# Patient Record
Sex: Female | Born: 1990 | Race: White | Hispanic: No | Marital: Married | Smoking: Never smoker
Health system: Southern US, Community
[De-identification: ages and names within clinical notes are randomized; demographics above are authoritative.]

## PROBLEM LIST (undated history)

## (undated) DIAGNOSIS — I10 Essential (primary) hypertension: Secondary | ICD-10-CM

## (undated) HISTORY — PX: ANKLE SURGERY: SHX546

## (undated) HISTORY — PX: NOSE SURGERY: SHX723

---

## 2018-04-22 NOTE — L&D Delivery Note (Addendum)
Operative Delivery Note At 6:37 AM a viable and healthy female was delivered via Vaginal, Spontaneous.  Presentation: vertex; Position: Right,, Occiput,, Anterior; Station: +5.  Delivery of the head: 07/28/2018  6:37 AM First maneuver: 07/28/2018  6:37 AM, [place] Infants head delivered. Initial attempt to deliver the anterior shoulder was not successful. A nuchal cord was found and reduced on the perineum. The patient was placed in [place] and the anterior shoulder delivered followed by the body. Duration of shoulder 15 seconds. The infant was passed to the maternal abdomen and was noted to be floppy but had an appropriate heart rate. The infant was vigorously stimulated and suctioned and made weak attempts to cry. THe cord was clamed and cut and the infant was passed to the waiting isolette for additional resuscitation. Ultimately a code apgar was called due to poor breathing efforts. The placenta was delivered spontaneously, intact, with 3VC. Uterine tone remained firm. A second degree perineal laceration was repaired with 3-0 vicryl. Amniotic fluid volume was not measured prior to delivery of the placenta. The measured blood loss was greater than clinically suspected.  Verbal consent: obtained from patient.  APGAR: 6, 7; weight pending .   Placenta status: spontaneous, intact.   Cord: 3V with nuchal cord reduced on the perineum  Anesthesia:  Epidural Episiotomy: None Lacerations: 2nd degree Suture Repair: 3.0 vicryl Est. Blood Loss (mL): 771  Mom to postpartum.  Baby to NICU  Waynard Reeds 07/28/2018, 7:06 AM

## 2018-07-13 LAB — OB RESULTS CONSOLE GBS: GBS: NEGATIVE

## 2018-07-24 ENCOUNTER — Other Ambulatory Visit: Payer: Self-pay | Admitting: Obstetrics and Gynecology

## 2018-07-27 ENCOUNTER — Inpatient Hospital Stay (HOSPITAL_COMMUNITY): Payer: 59 | Admitting: Anesthesiology

## 2018-07-27 ENCOUNTER — Other Ambulatory Visit: Payer: Self-pay

## 2018-07-27 ENCOUNTER — Inpatient Hospital Stay (HOSPITAL_COMMUNITY)
Admission: AD | Admit: 2018-07-27 | Discharge: 2018-07-30 | DRG: 807 | Disposition: A | Discharge status: Home / Self Care | Payer: 59 | Attending: Obstetrics and Gynecology | Admitting: Obstetrics and Gynecology

## 2018-07-27 ENCOUNTER — Encounter (HOSPITAL_COMMUNITY): Payer: Self-pay

## 2018-07-27 DIAGNOSIS — I10 Essential (primary) hypertension: Secondary | ICD-10-CM | POA: Diagnosis present

## 2018-07-27 DIAGNOSIS — Z3A39 39 weeks gestation of pregnancy: Secondary | ICD-10-CM

## 2018-07-27 DIAGNOSIS — O1002 Pre-existing essential hypertension complicating childbirth: Principal | ICD-10-CM | POA: Diagnosis present

## 2018-07-27 HISTORY — DX: Essential (primary) hypertension: I10

## 2018-07-27 LAB — COMPREHENSIVE METABOLIC PANEL
ALT: 24 U/L (ref 0–44)
AST: 24 U/L (ref 15–41)
Albumin: 3.1 g/dL — ABNORMAL LOW (ref 3.5–5.0)
Alkaline Phosphatase: 115 U/L (ref 38–126)
Anion gap: 10 (ref 5–15)
BUN: 7 mg/dL (ref 6–20)
CO2: 22 mmol/L (ref 22–32)
Calcium: 8.8 mg/dL — ABNORMAL LOW (ref 8.9–10.3)
Chloride: 100 mmol/L (ref 98–111)
Creatinine, Ser: 0.55 mg/dL (ref 0.44–1.00)
GFR calc Af Amer: >60 mL/min (ref >60)
GFR calc non Af Amer: >60 mL/min (ref >60)
Glucose, Bld: 80 mg/dL (ref 70–99)
Potassium: 3.3 mmol/L — ABNORMAL LOW (ref 3.5–5.1)
Sodium: 132 mmol/L — ABNORMAL LOW (ref 135–145)
Total Bilirubin: 0.4 mg/dL (ref 0.3–1.2)
Total Protein: 6.1 g/dL — ABNORMAL LOW (ref 6.5–8.1)

## 2018-07-27 LAB — CBC
HCT: 38.5 % (ref 36.0–46.0)
HCT: 39.5 % (ref 36.0–46.0)
Hemoglobin: 12.2 g/dL (ref 12.0–15.0)
Hemoglobin: 12.5 g/dL (ref 12.0–15.0)
MCH: 29.7 pg (ref 26.0–34.0)
MCH: 29.7 pg (ref 26.0–34.0)
MCHC: 31.7 g/dL (ref 30.0–36.0)
MCHC: 31.6 g/dL (ref 30.0–36.0)
MCV: 93.7 fL (ref 80.0–100.0)
MCV: 93.8 fL (ref 80.0–100.0)
Platelets: 237 10*3/uL (ref 150–400)
Platelets: 226 10*3/uL (ref 150–400)
RBC: 4.11 MIL/uL (ref 3.87–5.11)
RBC: 4.21 MIL/uL (ref 3.87–5.11)
RDW: 13.4 % (ref 11.5–15.5)
RDW: 13.4 % (ref 11.5–15.5)
WBC: 10.3 10*3/uL (ref 4.0–10.5)
WBC: 14.5 10*3/uL — ABNORMAL HIGH (ref 4.0–10.5)
nRBC: 0 % (ref 0.0–0.2)
nRBC: 0 % (ref 0.0–0.2)

## 2018-07-27 LAB — TYPE AND SCREEN
ABO/RH(D): O POS
Antibody Screen: NEGATIVE

## 2018-07-27 LAB — ABO/RH: ABO/RH(D): O POS

## 2018-07-27 LAB — URIC ACID: Uric Acid, Serum: 3 mg/dL (ref 2.5–7.1)

## 2018-07-27 LAB — RPR: RPR Ser Ql: NONREACTIVE

## 2018-07-27 LAB — LACTATE DEHYDROGENASE: LDH: 130 U/L (ref 98–192)

## 2018-07-27 MED ORDER — FENTANYL-BUPIVACAINE-NACL 0.5-0.125-0.9 MG/250ML-% EP SOLN
12.0000 mL/h | EPIDURAL | Status: DC | PRN
  Filled 2018-07-27: qty 250

## 2018-07-27 MED ORDER — LIDOCAINE HCL (PF) 1 % IJ SOLN
30.0000 mL | INTRAMUSCULAR | Status: AC | PRN
  Administered 2018-07-28: 30 mL via SUBCUTANEOUS
  Filled 2018-07-27 (×2): qty 30

## 2018-07-27 MED ORDER — SOD CITRATE-CITRIC ACID 500-334 MG/5ML PO SOLN: 30.0000 mL | ORAL | Status: DC | PRN

## 2018-07-27 MED ORDER — OXYTOCIN 40 UNITS IN NORMAL SALINE INFUSION - SIMPLE MED
2.5000 [IU]/h | INTRAVENOUS | Status: DC
  Filled 2018-07-27: qty 1000

## 2018-07-27 MED ORDER — OXYTOCIN BOLUS FROM INFUSION
500.0000 mL | Freq: Once | INTRAVENOUS | Status: AC
  Administered 2018-07-28: 500 mL via INTRAVENOUS

## 2018-07-27 MED ORDER — ONDANSETRON HCL 4 MG/2ML IJ SOLN
4.0000 mg | Freq: Four times a day (QID) | INTRAMUSCULAR | Status: DC | PRN
  Administered 2018-07-27: 4 mg via INTRAVENOUS
  Filled 2018-07-27: qty 2

## 2018-07-27 MED ORDER — MISOPROSTOL 25 MCG QUARTER TABLET
25.0000 ug | ORAL_TABLET | ORAL | Status: DC | PRN
  Administered 2018-07-27 (×2): 25 ug via VAGINAL
  Filled 2018-07-27 (×3): qty 1

## 2018-07-27 MED ORDER — OXYTOCIN 40 UNITS IN NORMAL SALINE INFUSION - SIMPLE MED
1.0000 m[IU]/min | INTRAVENOUS | Status: DC
  Administered 2018-07-27: 6 m[IU]/min via INTRAVENOUS
  Administered 2018-07-27: 2 m[IU]/min via INTRAVENOUS

## 2018-07-27 MED ORDER — EPHEDRINE 5 MG/ML INJ
10.0000 mg | INTRAVENOUS | Status: DC | PRN
  Filled 2018-07-27: qty 2

## 2018-07-27 MED ORDER — ACETAMINOPHEN 325 MG PO TABS
650.0000 mg | ORAL_TABLET | ORAL | Status: DC | PRN
  Administered 2018-07-28: 650 mg via ORAL
  Filled 2018-07-27: qty 2

## 2018-07-27 MED ORDER — LIDOCAINE HCL (PF) 1 % IJ SOLN
INTRAMUSCULAR | Status: DC | PRN
  Administered 2018-07-27 (×2): 5 mL via EPIDURAL

## 2018-07-27 MED ORDER — LACTATED RINGERS IV SOLN
500.0000 mL | INTRAVENOUS | Status: DC | PRN
  Administered 2018-07-28: 500 mL via INTRAVENOUS

## 2018-07-27 MED ORDER — LACTATED RINGERS IV SOLN: 500.0000 mL | Freq: Once | INTRAVENOUS | Status: DC

## 2018-07-27 MED ORDER — DIPHENHYDRAMINE HCL 50 MG/ML IJ SOLN: 12.5000 mg | INTRAMUSCULAR | Status: DC | PRN

## 2018-07-27 MED ORDER — BUTORPHANOL TARTRATE 1 MG/ML IJ SOLN
1.0000 mg | INTRAMUSCULAR | Status: DC | PRN
  Administered 2018-07-27: 1 mg via INTRAVENOUS
  Filled 2018-07-27: qty 1

## 2018-07-27 MED ORDER — TERBUTALINE SULFATE 1 MG/ML IJ SOLN: 0.2500 mg | Freq: Once | INTRAMUSCULAR | Status: DC | PRN

## 2018-07-27 MED ORDER — PHENYLEPHRINE 40 MCG/ML (10ML) SYRINGE FOR IV PUSH (FOR BLOOD PRESSURE SUPPORT)
80.0000 ug | PREFILLED_SYRINGE | INTRAVENOUS | Status: DC | PRN
  Filled 2018-07-27: qty 10

## 2018-07-27 MED ORDER — SODIUM CHLORIDE (PF) 0.9 % IJ SOLN
INTRAMUSCULAR | Status: DC | PRN
  Administered 2018-07-27: 12 mL/h via EPIDURAL

## 2018-07-27 MED ORDER — LACTATED RINGERS IV SOLN
INTRAVENOUS | Status: DC
  Administered 2018-07-27 – 2018-07-28 (×4): via INTRAVENOUS

## 2018-07-27 NOTE — Progress Notes (Signed)
Patient ID: Dominique Barnes, female   DOB: 1991-02-18, 28 y.o.   MRN: [id number]   S: Comfortable after epidural O:  Vitals:   07/27/18 1417 07/27/18 1421 07/27/18 1426 07/27/18 1431  BP: 130/74 127/71 126/70 120/70  Pulse: 78 90 82 78  Resp:      Temp:    98.5 F (36.9 C)  TempSrc:      Weight:      Height:       AOx 3 Cvx 4-5/60/-2 FHR 130 reactive, cat 1 tracing toco Q2-3  AROM clear fluid  Start pit 2 x 2

## 2018-07-27 NOTE — Anesthesia Preprocedure Evaluation (Signed)
Anesthesia Evaluation  Patient identified by MRN, date of birth, ID band Patient awake    Reviewed: Allergy & Precautions, NPO status , Patient's Chart, lab work & pertinent test results  Airway Mallampati: II  TM Distance: >3 FB     Dental no notable dental hx. (+) Dental Advisory Given   Pulmonary neg pulmonary ROS,    Pulmonary exam normal        Cardiovascular hypertension, negative cardio ROS Normal cardiovascular exam     Neuro/Psych negative neurological ROS  negative psych ROS   GI/Hepatic negative GI ROS, Neg liver ROS,   Endo/Other  negative endocrine ROS  Renal/GU negative Renal ROS  negative genitourinary   Musculoskeletal negative musculoskeletal ROS (+)   Abdominal   Peds negative pediatric ROS (+)  Hematology negative hematology ROS (+)   Anesthesia Other Findings   Reproductive/Obstetrics (+) Pregnancy                             Anesthesia Physical Anesthesia Plan  ASA: II  Anesthesia Plan: Epidural   Post-op Pain Management:    Induction:   PONV Risk Score and Plan:   Airway Management Planned: Natural Airway  Additional Equipment:   Intra-op Plan:   Post-operative Plan:   Informed Consent: I have reviewed the patients History and Physical, chart, labs and discussed the procedure including the risks, benefits and alternatives for the proposed anesthesia with the patient or authorized representative who has indicated his/her understanding and acceptance.     Dental advisory given  Plan Discussed with: Anesthesiologist  Anesthesia Plan Comments:         Anesthesia Quick Evaluation

## 2018-07-28 ENCOUNTER — Encounter (HOSPITAL_COMMUNITY): Payer: Self-pay | Admitting: Neonatal-Perinatal Medicine

## 2018-07-28 MED ORDER — SENNOSIDES-DOCUSATE SODIUM 8.6-50 MG PO TABS
2.0000 | ORAL_TABLET | ORAL | Status: DC
  Administered 2018-07-29 (×2): 2 via ORAL
  Filled 2018-07-28 (×3): qty 2

## 2018-07-28 MED ORDER — IBUPROFEN 600 MG PO TABS
600.0000 mg | ORAL_TABLET | Freq: Four times a day (QID) | ORAL | Status: DC
  Administered 2018-07-28 – 2018-07-30 (×7): 600 mg via ORAL
  Filled 2018-07-28 (×7): qty 1

## 2018-07-28 MED ORDER — ACETAMINOPHEN 325 MG PO TABS: 650.0000 mg | ORAL_TABLET | ORAL | Status: DC | PRN

## 2018-07-28 MED ORDER — OXYCODONE HCL 5 MG PO TABS: 10.0000 mg | ORAL_TABLET | ORAL | Status: DC | PRN

## 2018-07-28 MED ORDER — DIPHENHYDRAMINE HCL 25 MG PO CAPS: 25.0000 mg | ORAL_CAPSULE | Freq: Four times a day (QID) | ORAL | Status: DC | PRN

## 2018-07-28 MED ORDER — PRENATAL MULTIVITAMIN CH
1.0000 | ORAL_TABLET | Freq: Every day | ORAL | Status: DC
  Administered 2018-07-28 – 2018-07-29 (×2): 1 via ORAL
  Filled 2018-07-28 (×3): qty 1

## 2018-07-28 MED ORDER — OXYCODONE HCL 5 MG PO TABS: 5.0000 mg | ORAL_TABLET | ORAL | Status: DC | PRN

## 2018-07-28 MED ORDER — ONDANSETRON HCL 4 MG PO TABS: 4.0000 mg | ORAL_TABLET | ORAL | Status: DC | PRN

## 2018-07-28 MED ORDER — SIMETHICONE 80 MG PO CHEW
80.0000 mg | CHEWABLE_TABLET | ORAL | Status: DC | PRN
  Administered 2018-07-30: 80 mg via ORAL
  Filled 2018-07-28: qty 1

## 2018-07-28 MED ORDER — BENZOCAINE-MENTHOL 20-0.5 % EX AERO
1.0000 "application " | INHALATION_SPRAY | CUTANEOUS | Status: DC | PRN
  Administered 2018-07-28 – 2018-07-30 (×2): 1 via TOPICAL
  Filled 2018-07-28 (×2): qty 56

## 2018-07-28 MED ORDER — TETANUS-DIPHTH-ACELL PERTUSSIS 5-2.5-18.5 LF-MCG/0.5 IM SUSP: 0.5000 mL | Freq: Once | INTRAMUSCULAR | Status: DC

## 2018-07-28 MED ORDER — WITCH HAZEL-GLYCERIN EX PADS: 1.0000 "application " | MEDICATED_PAD | CUTANEOUS | Status: DC | PRN

## 2018-07-28 MED ORDER — IBUPROFEN 600 MG PO TABS: 600.0000 mg | ORAL_TABLET | Freq: Four times a day (QID) | ORAL | Status: DC | PRN

## 2018-07-28 MED ORDER — DIBUCAINE 1 % RE OINT
1.0000 "application " | TOPICAL_OINTMENT | RECTAL | Status: DC | PRN
  Administered 2018-07-30: 1 via RECTAL
  Filled 2018-07-28 (×4): qty 28

## 2018-07-28 MED ORDER — METHYLDOPA 250 MG PO TABS
250.0000 mg | ORAL_TABLET | Freq: Two times a day (BID) | ORAL | Status: DC
  Administered 2018-07-28 – 2018-07-30 (×5): 250 mg via ORAL
  Filled 2018-07-28 (×7): qty 1

## 2018-07-28 MED ORDER — ONDANSETRON HCL 4 MG/2ML IJ SOLN: 4.0000 mg | INTRAMUSCULAR | Status: DC | PRN

## 2018-07-28 MED ORDER — ACETAMINOPHEN 500 MG PO TABS
1000.0000 mg | ORAL_TABLET | Freq: Once | ORAL | Status: AC
  Administered 2018-07-28: 1000 mg via ORAL
  Filled 2018-07-28: qty 2

## 2018-07-28 MED ORDER — COCONUT OIL OIL: 1.0000 "application " | TOPICAL_OIL | Status: DC | PRN

## 2018-07-28 NOTE — Progress Notes (Signed)
Epidural capped at 0655.  Epidural line not in to chart on.

## 2018-07-28 NOTE — Lactation Note (Signed)
This note was copied from a baby's chart. Lactation Consultation Note  Patient Name: Dominique Barnes [id number] Date: 07/28/2018  Parents report they are ready to go to NICU.  Mom reports she has decided she is not going to breastfeed or pump. Urged mom to at least pump while infant in NICU.  She declined at this time. Urged mom to think about it.  She has no pump for home use.  Did not contact insurance provider about a breast pump. Reviewed and Left [facility] consultation Services and NICU booklet.  Urged mom to call lactation as needed.  Maternal Data    Feeding Feeding Type: Formula Nipple Type: Slow - flow  LATCH Score                   Interventions    Lactation Tools Discussed/Used WIC Program: No   Consult Status Consult Status: Complete    Angeliah Wisdom Michaelle Copas 07/28/2018, 8:14 PM

## 2018-07-28 NOTE — Lactation Note (Signed)
This note was copied from a baby's chart. Lactation Consultation Note  Patient Name: Dominique Barnes'S Date: 07/28/2018  Attempt to see mom on first floor.  Mom in NICU.  Will follow up with her later today   Maternal Data    Feeding Feeding Type: Formula Nipple Type: Slow - flow  LATCH Score                   Interventions    Lactation Tools Discussed/Used     Consult Status      Dominique Barnes 07/28/2018, 4:18 PM

## 2018-07-28 NOTE — Progress Notes (Signed)
Epidural cath removed from lower back. Site WNL. Blue tip on cath seen upon removal.

## 2018-07-28 NOTE — Anesthesia Postprocedure Evaluation (Signed)
Anesthesia Post Note  Patient: Dominique Barnes  Procedure(s) Performed: AN AD HOC LABOR EPIDURAL     Patient location during evaluation: Specials Recovery Anesthesia Type: Epidural Level of consciousness: awake and alert Pain management: pain level controlled Vital Signs Assessment: post-procedure vital signs reviewed and stable Respiratory status: spontaneous breathing, nonlabored ventilation and respiratory function stable Cardiovascular status: stable Postop Assessment: no headache, no backache and epidural receding Anesthetic complications: no    Last Vitals:  Vitals:   07/28/18 0848 07/28/18 1014  BP: 114/61 135/77  Pulse: 83 92  Resp:  17  Temp:  37.2 C  SpO2: 98% 100%    Last Pain:  Vitals:   07/28/18 1014  TempSrc: Oral  PainSc:    Pain Goal:                   Marquette Saa

## 2018-07-29 LAB — CBC
HCT: 31 % — ABNORMAL LOW (ref 36.0–46.0)
Hemoglobin: 10.1 g/dL — ABNORMAL LOW (ref 12.0–15.0)
MCH: 30.5 pg (ref 26.0–34.0)
MCHC: 32.6 g/dL (ref 30.0–36.0)
MCV: 93.7 fL (ref 80.0–100.0)
Platelets: 159 10*3/uL (ref 150–400)
RBC: 3.31 MIL/uL — ABNORMAL LOW (ref 3.87–5.11)
RDW: 13.8 % (ref 11.5–15.5)
WBC: 16 10*3/uL — ABNORMAL HIGH (ref 4.0–10.5)
nRBC: 0 % (ref 0.0–0.2)

## 2018-07-29 NOTE — Clinical Social Work Maternal (Signed)
CLINICAL SOCIAL WORK MATERNAL/CHILD NOTE  Patient Details  Name: Dominique Barnes MRN: [id number] Date of Birth: 17-Nov-1990  Date:  07/29/2018  Clinical Social Worker Initiating Note:  Laurey Arrow Date/Time: Initiated:  07/29/18/1133     Child's Name:  Dominique Barnes   Biological Parents:  Mother, Father   Need for Interpreter:  None   Reason for Referral:  Parental Support of Premature Babies < 32 weeks/or Critically Ill babies(Code Apgar)   Address:  [address] [place] [address]    Phone number:  [phone] (home)     Additional phone number:   Household Members/Support Persons (HM/SP):   Household Member/Support Person 1   HM/SP Name Relationship DOB or Age  HM/SP -1 Gerre Couch FOB/Husband 12/22/1991  HM/SP -2        HM/SP -3        HM/SP -4        HM/SP -5        HM/SP -6        HM/SP -7        HM/SP -8          Natural Supports (not living in the home):  Immediate Family, Extended Family, Parent   Professional Supports: None   Employment: Full-time   Type of Work: Building surveyor   Education:      Homebound arranged:    Museum/gallery curator Resources:  Multimedia programmer   Other Resources:      Cultural/Religious Considerations Which May Impact Care:  None Reported  Strengths:  Ability to meet basic needs , Home prepared for child , Pediatrician chosen   Psychotropic Medications:         Pediatrician:    [place] (including Technical sales engineer and surronding areas)  Pediatrician List:   [place]      Pediatrician Fax Number:    Risk Factors/Current Problems:  None   Cognitive State:  Able to Concentrate , Alert , Linear Thinking , Insightful    Mood/Affect:  Interested , Happy , Bright , Comfortable    CSW Assessment: CSW met with MOB and FOB in room 102 to complete an assessment for a Code Apgar.  When CSW arrived, MOB was resting in bed and FOB was watching TV.  CSW explained CSW's role and MOB gave CSW permission to complete assessment while FOB was present. Both parents were easy to engage and receptive to meeting with CSW.   CSW asked parents about their thoughts and feelings regarding infant's NICU admission. MOB and FOB expressed feeling nervous initially but feeling better now that they have a clear understanding of infant's health. MOB stated, "I'm just excited that we both will discharging tomorrow."  MOB and FOB shared their they desires to have infant to become a great basketball player since "He has big feet and hands and I am a basketball skills couch." They parents were able to have humor after a difficult birthing experience.  CSW provided education regarding the baby blues period vs. perinatal mood disorders, discussed treatment and gave resources for mental health follow up if concerns arise.  CSW recommends self-evaluation during the postpartum time period and encouraged MOB to contact a medical professional if symptoms are noted at any time.     CSW identifies no further need for intervention and no barriers to discharge at this time.  CSW Plan/Description:  No Further Intervention Required/No Barriers to Discharge, Perinatal Mood and Anxiety Disorder (PMADs) Education, Other Information/Referral to [employer], MSW, [employer] Clinical Social Work [phone]  Dimple Nanas, LCSW 07/29/2018, 11:39 AM

## 2018-07-29 NOTE — Progress Notes (Signed)
PPD#1  Pt without complaints. States baby is doing well.  VSSAF hgb-10.1 plts-159k  Imp/ stable Plan/ Routine care

## 2018-07-30 NOTE — Discharge Summary (Signed)
Obstetric Discharge Summary Reason for Admission: induction of labor and CHTN Prenatal Procedures: ultrasound Intrapartum Procedures: spontaneous vaginal delivery Postpartum Procedures: none Complications-Operative and Postpartum: shoulder dystocia Hemoglobin  Date Value Ref Range Status  07/29/2018 10.1 (L) 12.0 - 15.0 g/dL Final   HCT  Date Value Ref Range Status  07/29/2018 31.0 (L) 36.0 - 46.0 % Final    Physical Exam:  General: alert and cooperative Lochia: appropriate Uterine Fundus: firm DVT Evaluation: No evidence of DVT seen on physical exam.  Discharge Diagnoses: Term Pregnancy-delivered  Discharge Information: Date: 07/30/2018 Activity: pelvic rest Diet: routine Medications: PNV, Ibuprofen and methyldopa Condition: stable Instructions: refer to practice specific booklet Discharge to: home Follow-up Information    Waynard Reeds, MD. Call in 4 week(s).   Specialty:  Obstetrics and Gynecology Why:  Check BP at home and call with elevated readings >140/ >90.  Can call office to initially schedule f/u telemedicine appt in 4 weeks, can change to in person if having problems or if restrictions have been lifted. Contact information: [address] [place] [address] [phone]           Newborn Data: Live born female  Birth Weight: 7 lb 5.8 oz (3340 g) APGAR: 6, 7  Newborn Delivery   Time head delivered:  07/28/2018 06:37:00 Birth date/time:  07/28/2018 06:37:00 Delivery type:  Vaginal, Spontaneous     Baby to remain in NICU for jaundice  Calayah Guadarrama DO  07/30/2018, 11:05 AM

## 2018-08-01 ENCOUNTER — Inpatient Hospital Stay (HOSPITAL_COMMUNITY): Admission: AD | Admit: 2018-08-01 | Payer: 59 | Source: Home / Self Care | Admitting: Obstetrics and Gynecology

## 2018-08-14 NOTE — H&P (Signed)
Dominique Barnes is a 28 y.o. female presenting for IOL  28 yo G 2P0010 @ 39 weeks admitted for IOL for chronic hypertension OB History    Gravida  2   Para  1   Term  1   Preterm      AB      Living  1     SAB      TAB      Ectopic      Multiple  0   Live Births  1          Past Medical History:  Diagnosis Date  . Hypertension    History reviewed. No pertinent surgical history. Family History: family history is not on file. Social History:  reports that she has never smoked. She has never used smokeless tobacco. No history on file for alcohol and drug.     Maternal Diabetes: No Genetic Screening: Normal Maternal Ultrasounds/Referrals: Normal Fetal Ultrasounds or other Referrals:  None Maternal Substance Abuse:  No Significant Maternal Medications:  None Significant Maternal Lab Results:  None Other Comments:  None  ROS History Dilation: 10 Effacement (%): 90 Station: -1 Exam by:: [place]  Blood pressure 124/79, pulse 76, temperature 98.9 F (37.2 C), temperature source Oral, resp. rate 18, height 5\' 7"  (1.702 m), weight 77.6 kg, SpO2 99 %, unknown if currently breastfeeding. Exam Physical Exam  Prenatal labs: ABO, Rh: --/--/O POS, O POS (04/06 0100) Antibody: NEG (04/06 0100) Rubella:   RPR: Non Reactive (04/06 0100)  HBsAg:    HIV:    GBS: Negative (03/23 0000)   Assessment/Plan: 1) Admit 2) Epidural on request 3) Anticipate svd   Waynard Reeds 08/14/2018, 10:55 AM

## 2018-12-23 LAB — OB RESULTS CONSOLE HIV ANTIBODY (ROUTINE TESTING): HIV: NONREACTIVE

## 2018-12-23 LAB — OB RESULTS CONSOLE ABO/RH: RH Type: POSITIVE

## 2018-12-23 LAB — OB RESULTS CONSOLE GC/CHLAMYDIA
Chlamydia: NEGATIVE
Gonorrhea: NEGATIVE

## 2018-12-23 LAB — OB RESULTS CONSOLE RPR: RPR: NONREACTIVE

## 2018-12-23 LAB — OB RESULTS CONSOLE ANTIBODY SCREEN: Antibody Screen: NEGATIVE

## 2018-12-23 LAB — OB RESULTS CONSOLE RUBELLA ANTIBODY, IGM: Rubella: IMMUNE

## 2018-12-23 LAB — OB RESULTS CONSOLE HEPATITIS B SURFACE ANTIGEN: Hepatitis B Surface Ag: NEGATIVE

## 2019-04-23 NOTE — L&D Delivery Note (Signed)
Patient was C/C/+3 and pushed for 4 minutes with epidural.    NSVD.  A Female infant, Apgars 8,9, weight P.  Delivery time 1427.  I reached up and felt baby B's legs.  ROM clear. I was able to grasp both feet but unable to bring the baby down past the hymen.  The R leg retracted and I was able to deliver the L leg and then the R leg.  The baby was then delivered in the normal breech position and the head delivered with retraction of the chin.   Breech extraction B female infant, Apgars  7,9,9 , weight P. Delivery time 1432.  Cord gas was sent for B- 7.23. Baby B active after warming and short term bagging with O2.    The patient had a first degree midline laceration repaired with 2-0 vicryl. Fundus was firm. EBL was expected amount. Placenta was delivered intact. Vagina was clear.  Delayed cord clamping done for 30-60 seconds while warming baby. Baby was vigorous and doing skin to skin with mother.  Loney Laurence

## 2019-06-28 LAB — OB RESULTS CONSOLE GBS: GBS: NEGATIVE

## 2019-06-30 ENCOUNTER — Other Ambulatory Visit: Payer: Self-pay | Admitting: Obstetrics and Gynecology

## 2019-06-30 ENCOUNTER — Encounter (HOSPITAL_COMMUNITY): Payer: Self-pay | Admitting: *Deleted

## 2019-06-30 ENCOUNTER — Telehealth (HOSPITAL_COMMUNITY): Payer: Self-pay | Admitting: *Deleted

## 2019-06-30 NOTE — Telephone Encounter (Signed)
Preadmission screen  

## 2019-07-01 ENCOUNTER — Encounter (HOSPITAL_COMMUNITY): Payer: Self-pay | Admitting: *Deleted

## 2019-07-07 ENCOUNTER — Other Ambulatory Visit (HOSPITAL_COMMUNITY)
Admission: RE | Admit: 2019-07-07 | Discharge: 2019-07-07 | Disposition: A | Discharge status: Home / Self Care | Payer: 59 | Source: Ambulatory Visit | Attending: Obstetrics and Gynecology | Admitting: Obstetrics and Gynecology

## 2019-07-07 LAB — SARS CORONAVIRUS 2 (TAT 6-24 HRS): SARS Coronavirus 2: NEGATIVE

## 2019-07-09 ENCOUNTER — Encounter (HOSPITAL_COMMUNITY): Payer: Self-pay | Admitting: Obstetrics and Gynecology

## 2019-07-09 ENCOUNTER — Inpatient Hospital Stay (HOSPITAL_COMMUNITY)
Admission: AD | Admit: 2019-07-09 | Discharge: 2019-07-11 | DRG: 806 | Disposition: A | Discharge status: Home / Self Care | Payer: 59 | Attending: Obstetrics and Gynecology | Admitting: Obstetrics and Gynecology

## 2019-07-09 ENCOUNTER — Inpatient Hospital Stay (HOSPITAL_COMMUNITY): Payer: 59 | Admitting: Anesthesiology

## 2019-07-09 ENCOUNTER — Encounter (HOSPITAL_COMMUNITY)
Admission: AD | Disposition: A | Discharge status: Home / Self Care | Payer: Self-pay | Source: Home / Self Care | Attending: Obstetrics and Gynecology

## 2019-07-09 ENCOUNTER — Inpatient Hospital Stay (HOSPITAL_COMMUNITY): Payer: 59

## 2019-07-09 DIAGNOSIS — Z20822 Contact with and (suspected) exposure to covid-19: Secondary | ICD-10-CM | POA: Diagnosis present

## 2019-07-09 DIAGNOSIS — O9902 Anemia complicating childbirth: Secondary | ICD-10-CM | POA: Diagnosis present

## 2019-07-09 DIAGNOSIS — O321XX2 Maternal care for breech presentation, fetus 2: Secondary | ICD-10-CM | POA: Diagnosis present

## 2019-07-09 DIAGNOSIS — O30043 Twin pregnancy, dichorionic/diamniotic, third trimester: Secondary | ICD-10-CM | POA: Diagnosis present

## 2019-07-09 DIAGNOSIS — O1002 Pre-existing essential hypertension complicating childbirth: Secondary | ICD-10-CM | POA: Diagnosis present

## 2019-07-09 DIAGNOSIS — Z3A37 37 weeks gestation of pregnancy: Secondary | ICD-10-CM | POA: Diagnosis not present

## 2019-07-09 DIAGNOSIS — D649 Anemia, unspecified: Secondary | ICD-10-CM | POA: Diagnosis present

## 2019-07-09 DIAGNOSIS — I10 Essential (primary) hypertension: Secondary | ICD-10-CM | POA: Diagnosis present

## 2019-07-09 LAB — CBC
HCT: 34.2 % — ABNORMAL LOW (ref 36.0–46.0)
HCT: 31.2 % — ABNORMAL LOW (ref 36.0–46.0)
Hemoglobin: 10.9 g/dL — ABNORMAL LOW (ref 12.0–15.0)
Hemoglobin: 10.2 g/dL — ABNORMAL LOW (ref 12.0–15.0)
MCH: 29.4 pg (ref 26.0–34.0)
MCH: 29.1 pg (ref 26.0–34.0)
MCHC: 31.9 g/dL (ref 30.0–36.0)
MCHC: 32.7 g/dL (ref 30.0–36.0)
MCV: 92.2 fL (ref 80.0–100.0)
MCV: 89.1 fL (ref 80.0–100.0)
Platelets: 240 10*3/uL (ref 150–400)
Platelets: 212 10*3/uL (ref 150–400)
RBC: 3.71 MIL/uL — ABNORMAL LOW (ref 3.87–5.11)
RBC: 3.5 MIL/uL — ABNORMAL LOW (ref 3.87–5.11)
RDW: 13.3 % (ref 11.5–15.5)
RDW: 13.2 % (ref 11.5–15.5)
WBC: 8.8 10*3/uL (ref 4.0–10.5)
WBC: 14 10*3/uL — ABNORMAL HIGH (ref 4.0–10.5)
nRBC: 0 % (ref 0.0–0.2)
nRBC: 0 % (ref 0.0–0.2)

## 2019-07-09 LAB — COMPREHENSIVE METABOLIC PANEL
ALT: 19 U/L (ref 0–44)
AST: 22 U/L (ref 15–41)
Albumin: 2.9 g/dL — ABNORMAL LOW (ref 3.5–5.0)
Alkaline Phosphatase: 169 U/L — ABNORMAL HIGH (ref 38–126)
Anion gap: 11 (ref 5–15)
BUN: 7 mg/dL (ref 6–20)
CO2: 22 mmol/L (ref 22–32)
Calcium: 8.8 mg/dL — ABNORMAL LOW (ref 8.9–10.3)
Chloride: 106 mmol/L (ref 98–111)
Creatinine, Ser: 0.68 mg/dL (ref 0.44–1.00)
GFR calc Af Amer: >60 mL/min (ref >60)
GFR calc non Af Amer: >60 mL/min (ref >60)
Glucose, Bld: 85 mg/dL (ref 70–99)
Potassium: 4.2 mmol/L (ref 3.5–5.1)
Sodium: 139 mmol/L (ref 135–145)
Total Bilirubin: 0.5 mg/dL (ref 0.3–1.2)
Total Protein: 6.2 g/dL — ABNORMAL LOW (ref 6.5–8.1)

## 2019-07-09 LAB — URIC ACID: Uric Acid, Serum: 4 mg/dL (ref 2.5–7.1)

## 2019-07-09 LAB — RPR: RPR Ser Ql: NONREACTIVE

## 2019-07-09 SURGERY — Surgical Case
Anesthesia: Epidural | Site: Vagina | Wound class: Clean Contaminated

## 2019-07-09 MED ORDER — OXYTOCIN 40 UNITS IN NORMAL SALINE INFUSION - SIMPLE MED: 1.0000 m[IU]/min | INTRAVENOUS | Status: DC

## 2019-07-09 MED ORDER — ACETAMINOPHEN 325 MG PO TABS
650.0000 mg | ORAL_TABLET | ORAL | Status: DC | PRN
  Administered 2019-07-09 – 2019-07-10 (×2): 650 mg via ORAL
  Filled 2019-07-09 (×2): qty 2

## 2019-07-09 MED ORDER — BUTORPHANOL TARTRATE 1 MG/ML IJ SOLN
INTRAMUSCULAR | Status: AC
  Administered 2019-07-09: 1 mg via INTRAVENOUS
  Filled 2019-07-09: qty 1

## 2019-07-09 MED ORDER — METHYLERGONOVINE MALEATE 0.2 MG/ML IJ SOLN
0.2000 mg | INTRAMUSCULAR | Status: DC | PRN
  Filled 2019-07-09: qty 1

## 2019-07-09 MED ORDER — OXYTOCIN 40 UNITS IN NORMAL SALINE INFUSION - SIMPLE MED
1.0000 m[IU]/min | INTRAVENOUS | Status: DC
  Administered 2019-07-09: 4 m[IU]/min via INTRAVENOUS
  Filled 2019-07-09: qty 1000

## 2019-07-09 MED ORDER — DIPHENOXYLATE-ATROPINE 2.5-0.025 MG PO TABS
1.0000 | ORAL_TABLET | Freq: Four times a day (QID) | ORAL | Status: DC | PRN
  Administered 2019-07-09: 1 via ORAL
  Filled 2019-07-09: qty 1

## 2019-07-09 MED ORDER — FENTANYL-BUPIVACAINE-NACL 0.5-0.125-0.9 MG/250ML-% EP SOLN
12.0000 mL/h | EPIDURAL | Status: DC | PRN
  Filled 2019-07-09: qty 250

## 2019-07-09 MED ORDER — EPHEDRINE 5 MG/ML INJ: 10.0000 mg | INTRAVENOUS | Status: DC | PRN

## 2019-07-09 MED ORDER — ZOLPIDEM TARTRATE 5 MG PO TABS: 5.0000 mg | ORAL_TABLET | Freq: Every evening | ORAL | Status: DC | PRN

## 2019-07-09 MED ORDER — COCONUT OIL OIL: 1.0000 "application " | TOPICAL_OIL | Status: DC | PRN

## 2019-07-09 MED ORDER — LACTATED RINGERS IV SOLN: 500.0000 mL | INTRAVENOUS | Status: DC | PRN

## 2019-07-09 MED ORDER — PHENYLEPHRINE 40 MCG/ML (10ML) SYRINGE FOR IV PUSH (FOR BLOOD PRESSURE SUPPORT)
80.0000 ug | PREFILLED_SYRINGE | INTRAVENOUS | Status: DC | PRN
  Filled 2019-07-09: qty 10

## 2019-07-09 MED ORDER — OXYCODONE-ACETAMINOPHEN 5-325 MG PO TABS: 2.0000 | ORAL_TABLET | ORAL | Status: DC | PRN

## 2019-07-09 MED ORDER — LACTATED RINGERS IV SOLN: 500.0000 mL | Freq: Once | INTRAVENOUS | Status: DC

## 2019-07-09 MED ORDER — SODIUM CHLORIDE 0.9% FLUSH: 3.0000 mL | INTRAVENOUS | Status: DC | PRN

## 2019-07-09 MED ORDER — SOD CITRATE-CITRIC ACID 500-334 MG/5ML PO SOLN: 30.0000 mL | ORAL | Status: DC | PRN

## 2019-07-09 MED ORDER — MAGNESIUM HYDROXIDE 400 MG/5ML PO SUSP: 30.0000 mL | ORAL | Status: DC | PRN

## 2019-07-09 MED ORDER — DIPHENHYDRAMINE HCL 50 MG/ML IJ SOLN: 12.5000 mg | INTRAMUSCULAR | Status: DC | PRN

## 2019-07-09 MED ORDER — IBUPROFEN 800 MG PO TABS
800.0000 mg | ORAL_TABLET | Freq: Three times a day (TID) | ORAL | Status: DC
  Administered 2019-07-09 – 2019-07-11 (×5): 800 mg via ORAL
  Filled 2019-07-09 (×5): qty 1

## 2019-07-09 MED ORDER — ONDANSETRON HCL 4 MG PO TABS: 4.0000 mg | ORAL_TABLET | ORAL | Status: DC | PRN

## 2019-07-09 MED ORDER — BUTORPHANOL TARTRATE 1 MG/ML IJ SOLN
INTRAMUSCULAR | Status: AC
  Filled 2019-07-09: qty 1

## 2019-07-09 MED ORDER — BUTORPHANOL TARTRATE 1 MG/ML IJ SOLN: 1.0000 mg | Freq: Once | INTRAMUSCULAR | Status: AC

## 2019-07-09 MED ORDER — ACETAMINOPHEN 325 MG PO TABS: 650.0000 mg | ORAL_TABLET | ORAL | Status: DC | PRN

## 2019-07-09 MED ORDER — FERROUS SULFATE 325 (65 FE) MG PO TABS
325.0000 mg | ORAL_TABLET | Freq: Two times a day (BID) | ORAL | Status: DC
  Administered 2019-07-10: 325 mg via ORAL
  Filled 2019-07-09: qty 1

## 2019-07-09 MED ORDER — LACTATED RINGERS IV SOLN: INTRAVENOUS | Status: DC

## 2019-07-09 MED ORDER — HYDROMORPHONE HCL 1 MG/ML IJ SOLN: 1.0000 mg | INTRAMUSCULAR | Status: DC | PRN

## 2019-07-09 MED ORDER — HYDROMORPHONE HCL 1 MG/ML IJ SOLN
INTRAMUSCULAR | Status: AC
  Administered 2019-07-09: 1 mg via INTRAVENOUS
  Filled 2019-07-09: qty 1

## 2019-07-09 MED ORDER — OXYTOCIN 40 UNITS IN NORMAL SALINE INFUSION - SIMPLE MED: 40.0000 m[IU]/h | Freq: Once | INTRAVENOUS | Status: AC

## 2019-07-09 MED ORDER — WITCH HAZEL-GLYCERIN EX PADS: 1.0000 "application " | MEDICATED_PAD | CUTANEOUS | Status: DC | PRN

## 2019-07-09 MED ORDER — SODIUM CHLORIDE (PF) 0.9 % IJ SOLN
INTRAMUSCULAR | Status: DC | PRN
  Administered 2019-07-09: 12 mL/h via EPIDURAL

## 2019-07-09 MED ORDER — SODIUM CHLORIDE 0.9% FLUSH
3.0000 mL | Freq: Two times a day (BID) | INTRAVENOUS | Status: DC
  Administered 2019-07-10: 3 mL via INTRAVENOUS

## 2019-07-09 MED ORDER — SENNOSIDES-DOCUSATE SODIUM 8.6-50 MG PO TABS
2.0000 | ORAL_TABLET | ORAL | Status: DC
  Administered 2019-07-10: 2 via ORAL
  Filled 2019-07-09: qty 2

## 2019-07-09 MED ORDER — OXYTOCIN 40 UNITS IN NORMAL SALINE INFUSION - SIMPLE MED: 2.5000 [IU]/h | INTRAVENOUS | Status: DC

## 2019-07-09 MED ORDER — OXYTOCIN BOLUS FROM INFUSION: 500.0000 mL | Freq: Once | INTRAVENOUS | Status: DC

## 2019-07-09 MED ORDER — OXYTOCIN 40 UNITS IN NORMAL SALINE INFUSION - SIMPLE MED: 40.0000 m[IU]/h | INTRAVENOUS | Status: DC

## 2019-07-09 MED ORDER — METHYLERGONOVINE MALEATE 0.2 MG PO TABS: 0.2000 mg | ORAL_TABLET | ORAL | Status: DC | PRN

## 2019-07-09 MED ORDER — HYDROMORPHONE HCL 1 MG/ML IJ SOLN: 1.0000 mg | Freq: Once | INTRAMUSCULAR | Status: AC

## 2019-07-09 MED ORDER — TERBUTALINE SULFATE 1 MG/ML IJ SOLN: 0.2500 mg | Freq: Once | INTRAMUSCULAR | Status: DC | PRN

## 2019-07-09 MED ORDER — FENTANYL CITRATE (PF) 100 MCG/2ML IJ SOLN: 50.0000 ug | INTRAMUSCULAR | Status: DC | PRN

## 2019-07-09 MED ORDER — PHENYLEPHRINE 40 MCG/ML (10ML) SYRINGE FOR IV PUSH (FOR BLOOD PRESSURE SUPPORT): 80.0000 ug | PREFILLED_SYRINGE | INTRAVENOUS | Status: DC | PRN

## 2019-07-09 MED ORDER — MEASLES, MUMPS & RUBELLA VAC IJ SOLR: 0.5000 mL | Freq: Once | INTRAMUSCULAR | Status: DC

## 2019-07-09 MED ORDER — SODIUM BICARBONATE 8.4 % IV SOLN
INTRAVENOUS | Status: DC | PRN
  Administered 2019-07-09: 5 mL via EPIDURAL

## 2019-07-09 MED ORDER — LIDOCAINE HCL (PF) 1 % IJ SOLN
INTRAMUSCULAR | Status: DC | PRN
  Administered 2019-07-09: 5 mL via EPIDURAL
  Administered 2019-07-09: 2 mL via EPIDURAL
  Administered 2019-07-09: 3 mL via EPIDURAL

## 2019-07-09 MED ORDER — OXYTOCIN 40 UNITS IN NORMAL SALINE INFUSION - SIMPLE MED
INTRAVENOUS | Status: AC
  Filled 2019-07-09: qty 1000

## 2019-07-09 MED ORDER — DIBUCAINE (PERIANAL) 1 % EX OINT: 1.0000 "application " | TOPICAL_OINTMENT | CUTANEOUS | Status: DC | PRN

## 2019-07-09 MED ORDER — CARBOPROST TROMETHAMINE 250 MCG/ML IM SOLN
INTRAMUSCULAR | Status: AC
  Administered 2019-07-09: 250 ug
  Filled 2019-07-09: qty 1

## 2019-07-09 MED ORDER — LIDOCAINE-EPINEPHRINE (PF) 2 %-1:200000 IJ SOLN
INTRAMUSCULAR | Status: AC
  Filled 2019-07-09: qty 10

## 2019-07-09 MED ORDER — METHYLERGONOVINE MALEATE 0.2 MG/ML IJ SOLN
0.2000 mg | Freq: Once | INTRAMUSCULAR | Status: AC
  Administered 2019-07-09: 0.2 mg via INTRAMUSCULAR

## 2019-07-09 MED ORDER — BUTORPHANOL TARTRATE 1 MG/ML IJ SOLN
1.0000 mg | Freq: Once | INTRAMUSCULAR | Status: AC
  Administered 2019-07-09: 1 mg via INTRAVENOUS

## 2019-07-09 MED ORDER — PRENATAL MULTIVITAMIN CH
1.0000 | ORAL_TABLET | Freq: Every day | ORAL | Status: DC
  Administered 2019-07-10: 1 via ORAL
  Filled 2019-07-09: qty 1

## 2019-07-09 MED ORDER — DIPHENHYDRAMINE HCL 25 MG PO CAPS: 25.0000 mg | ORAL_CAPSULE | Freq: Four times a day (QID) | ORAL | Status: DC | PRN

## 2019-07-09 MED ORDER — SIMETHICONE 80 MG PO CHEW: 80.0000 mg | CHEWABLE_TABLET | ORAL | Status: DC | PRN

## 2019-07-09 MED ORDER — ONDANSETRON HCL 4 MG/2ML IJ SOLN
4.0000 mg | INTRAMUSCULAR | Status: DC | PRN
  Administered 2019-07-09: 4 mg via INTRAVENOUS
  Filled 2019-07-09: qty 2

## 2019-07-09 MED ORDER — LIDOCAINE HCL (PF) 1 % IJ SOLN: 30.0000 mL | INTRAMUSCULAR | Status: DC | PRN

## 2019-07-09 MED ORDER — TETANUS-DIPHTH-ACELL PERTUSSIS 5-2.5-18.5 LF-MCG/0.5 IM SUSP: 0.5000 mL | Freq: Once | INTRAMUSCULAR | Status: DC

## 2019-07-09 MED ORDER — SODIUM CHLORIDE 0.9 % IV SOLN: 250.0000 mL | INTRAVENOUS | Status: DC | PRN

## 2019-07-09 MED ORDER — ONDANSETRON HCL 4 MG/2ML IJ SOLN: 4.0000 mg | Freq: Four times a day (QID) | INTRAMUSCULAR | Status: DC | PRN

## 2019-07-09 MED ORDER — OXYTOCIN 40 UNITS IN NORMAL SALINE INFUSION - SIMPLE MED
INTRAVENOUS | Status: AC
  Administered 2019-07-09: 39960 m[IU]/h via INTRAVENOUS
  Filled 2019-07-09: qty 1000

## 2019-07-09 MED ORDER — BENZOCAINE-MENTHOL 20-0.5 % EX AERO
1.0000 "application " | INHALATION_SPRAY | CUTANEOUS | Status: DC | PRN
  Administered 2019-07-09: 1 via TOPICAL
  Filled 2019-07-09: qty 56

## 2019-07-09 SURGICAL SUPPLY — 33 items
BENZOIN TINCTURE PRP APPL 2/3 (GAUZE/BANDAGES/DRESSINGS) ×3 IMPLANT
CHLORAPREP W/TINT 26ML (MISCELLANEOUS) ×3 IMPLANT
CLAMP CORD UMBIL (MISCELLANEOUS) IMPLANT
CLOSURE WOUND 1/2 X4 (GAUZE/BANDAGES/DRESSINGS) ×1
CLOTH BEACON ORANGE TIMEOUT ST (SAFETY) ×3 IMPLANT
DRSG OPSITE POSTOP 4X10 (GAUZE/BANDAGES/DRESSINGS) ×3 IMPLANT
ELECT REM PT RETURN 9FT ADLT (ELECTROSURGICAL) ×3
ELECTRODE REM PT RTRN 9FT ADLT (ELECTROSURGICAL) ×1 IMPLANT
EXTRACTOR VACUUM BELL STYLE (SUCTIONS) IMPLANT
GLOVE BIO SURGEON STRL SZ7 (GLOVE) ×3 IMPLANT
GLOVE BIOGEL PI IND STRL 7.0 (GLOVE) ×1 IMPLANT
GLOVE BIOGEL PI INDICATOR 7.0 (GLOVE) ×2
GOWN STRL REUS W/TWL LRG LVL3 (GOWN DISPOSABLE) ×6 IMPLANT
KIT ABG SYR 3ML LUER SLIP (SYRINGE) IMPLANT
NDL HYPO 25X5/8 SAFETYGLIDE (NEEDLE) IMPLANT
NEEDLE HYPO 25X5/8 SAFETYGLIDE (NEEDLE) IMPLANT
NS IRRIG 1000ML POUR BTL (IV SOLUTION) ×3 IMPLANT
PACK C SECTION WH (CUSTOM PROCEDURE TRAY) ×3 IMPLANT
PAD OB MATERNITY 4.3X12.25 (PERSONAL CARE ITEMS) ×3 IMPLANT
PENCIL SMOKE EVAC W/HOLSTER (ELECTROSURGICAL) ×3 IMPLANT
RTRCTR C-SECT PINK 25CM LRG (MISCELLANEOUS) ×3 IMPLANT
STRIP CLOSURE SKIN 1/2X4 (GAUZE/BANDAGES/DRESSINGS) ×2 IMPLANT
SUT MNCRL 0 VIOLET CTX 36 (SUTURE) ×2 IMPLANT
SUT MONOCRYL 0 CTX 36 (SUTURE) ×4
SUT PLAIN 2 0 XLH (SUTURE) IMPLANT
SUT VIC AB 0 CT1 27 (SUTURE) ×4
SUT VIC AB 0 CT1 27XBRD ANBCTR (SUTURE) ×2 IMPLANT
SUT VIC AB 2-0 CT1 27 (SUTURE) ×2
SUT VIC AB 2-0 CT1 TAPERPNT 27 (SUTURE) ×1 IMPLANT
SUT VIC AB 4-0 KS 27 (SUTURE) ×3 IMPLANT
TOWEL OR 17X24 6PK STRL BLUE (TOWEL DISPOSABLE) ×3 IMPLANT
TRAY FOLEY W/BAG SLVR 14FR LF (SET/KITS/TRAYS/PACK) ×3 IMPLANT
WATER STERILE IRR 1000ML POUR (IV SOLUTION) ×3 IMPLANT

## 2019-07-09 NOTE — H&P (Signed)
29 y.o. [redacted]w[redacted]d  G2P1001 comes in for induction at term for AMA, di-di twins and CHTN on procardia.  Otherwise has good fetal movement and no bleeding.  Past Medical History:  Diagnosis Date  . Hypertension     Past Surgical History:  Procedure Laterality Date  . ANKLE SURGERY    . NOSE SURGERY      OB History  Gravida Para Term Preterm AB Living  2 1 1     1   SAB TAB Ectopic Multiple Live Births        0 1    # Outcome Date GA Lbr Len/2nd Weight Sex Delivery Anes PTL Lv  2 Current           1 Term 07/28/18 [redacted]w[redacted]d 13:35 / 01:59 3340 g M Vag-Spont EPI  LIV    Social History   Socioeconomic History  . Marital status: Married    Spouse name: Not on file  . Number of children: Not on file  . Years of education: Not on file  . Highest education level: Not on file  Occupational History  . Not on file  Tobacco Use  . Smoking status: Never Smoker  . Smokeless tobacco: Never Used  Substance and Sexual Activity  . Alcohol use: Not on file  . Drug use: Not on file  . Sexual activity: Yes  Other Topics Concern  . Not on file  Social History Narrative  . Not on file   Social Determinants of Health   Financial Resource Strain:   . Difficulty of Paying Living Expenses:   Food Insecurity:   . Worried About [redacted]w[redacted]d in the Last Year:   . Programme researcher, broadcasting/film/video in the Last Year:   Transportation Needs:   . Barista (Medical):   Freight forwarder Lack of Transportation (Non-Medical):   Physical Activity:   . Days of Exercise per Week:   . Minutes of Exercise per Session:   Stress:   . Feeling of Stress :   Social Connections:   . Frequency of Communication with Friends and Family:   . Frequency of Social Gatherings with Friends and Family:   . Attends Religious Services:   . Active Member of Clubs or Organizations:   . Attends [employer] Meetings:   Banker Marital Status:   Intimate Partner Violence:   . Fear of Current or Ex-Partner:   . Emotionally Abused:   [employer]  Physically Abused:   . Sexually Abused:    Patient has no known allergies.    Prenatal Transfer Tool  Maternal Diabetes: No Genetic Screening: Declined Maternal Ultrasounds/Referrals: Normal Di-Di twins Fetal Ultrasounds or other Referrals:  None Maternal Substance Abuse:  No Significant Maternal Medications:  Meds include: Other: Procardia  Significant Maternal Lab Results: Group B Strep negative  Other PNC: uncomplicated.  Twins with only 6% discord, larger one B but only by a few ounces.   VTX/Complete Breech in office.  ANT was reassuring.      Vitals:   07/09/19 0750 07/09/19 0806  BP: (!) 142/92   Pulse: (!) 102   Resp: 18   Temp: 98.2 F (36.8 C)   TempSrc: Oral Oral  Weight:  82.1 kg  Height:  5\' 7"  (1.702 m)    Lungs/Cor:  NAD Abdomen:  soft, gravid Ex:  no cords, erythema SVE:  4/C/-3 FHTs:  A 140s, good STV, NST R; Cat 1 tracing. B  130s, good STV, NST R; Cat 1 tracing.  Toco:  q irreg   A/P   AMA Di/Di Twins and CHTN on procardia.  For induction today. Pt counseled about the risks and benefits of Breech extraction for second twin and desires vaginal delivery.   GBS neg.   Daria Pastures

## 2019-07-09 NOTE — Anesthesia Preprocedure Evaluation (Signed)
Anesthesia Evaluation  Patient identified by MRN, date of birth, ID band Patient awake    Reviewed: Allergy & Precautions, NPO status , Patient's Chart, lab work & pertinent test results  Airway Mallampati: II  TM Distance: >3 FB Neck ROM: Full    Dental  (+) Teeth Intact, Dental Advisory Given   Pulmonary neg pulmonary ROS,    Pulmonary exam normal breath sounds clear to auscultation       Cardiovascular hypertension ([place]), Pt. on medications Normal cardiovascular exam Rhythm:Regular Rate:Normal     Neuro/Psych negative neurological ROS     GI/Hepatic negative GI ROS, Neg liver ROS,   Endo/Other  negative endocrine ROS  Renal/GU negative Renal ROS     Musculoskeletal negative musculoskeletal ROS (+)   Abdominal   Peds  Hematology  (+) Blood dyscrasia, anemia , Plt 240k   Anesthesia Other Findings Day of surgery medications reviewed with the patient.  Reproductive/Obstetrics (+) Pregnancy (Twins)                             Anesthesia Physical Anesthesia Plan  ASA: III  Anesthesia Plan: Epidural   Post-op Pain Management:    Induction:   PONV Risk Score and Plan: 2 and Treatment may vary due to age or medical condition  Airway Management Planned: Natural Airway  Additional Equipment:   Intra-op Plan:   Post-operative Plan:   Informed Consent: I have reviewed the patients History and Physical, chart, labs and discussed the procedure including the risks, benefits and alternatives for the proposed anesthesia with the patient or authorized representative who has indicated his/her understanding and acceptance.     Dental advisory given  Plan Discussed with:   Anesthesia Plan Comments: (Patient identified. Risks/Benefits/Options discussed with patient including but not limited to bleeding, infection, nerve damage, paralysis, failed block, incomplete pain control,  headache, blood pressure changes, nausea, vomiting, reactions to medication both or allergic, itching and postpartum back pain. Confirmed with bedside nurse the patient's most recent platelet count. Confirmed with patient that they are not currently taking any anticoagulation, have any bleeding history or any family history of bleeding disorders. Patient expressed understanding and wished to proceed. All questions were answered. )        Anesthesia Quick Evaluation

## 2019-07-09 NOTE — Progress Notes (Signed)
Upon performing pt one hour check, uterus was noted to be 1 above the umbilicus and deviated to the right, bleeding noted to be small to moderate.  While ambulating to the restroom to empty bladder, pt became dizzy and lightheaded and began to pass out.  Pt brought back to the bed to stabilize.  In and out cath performed to empty bladder, emptied from the bladder, uterus was still deviated to the right and small trickle of blood noted.  Uterine massage was performed and a large sized clot approximately cantalope sized expelled at that time.  Dr Henderson Cloud notified and came to bedside. Methergine, Pitocin, and Hemobate given per order to control bleeding and Stadol for pain.  Dr Henderson Cloud then extracted several more clots by manual removal and ordered indwelling foley catheter.  Catheter placed and uterus was midline, 2 below umbilicus, with small amount of bleeding.  Dilaudid also given IV for continued pain.  Vital signs stable.  Will continue to monitor.

## 2019-07-09 NOTE — Anesthesia Procedure Notes (Signed)
Epidural Patient location during procedure: OB Start time: 07/09/2019 10:52 AM End time: 07/09/2019 10:58 AM  Staffing Anesthesiologist: Cecile Hearing, MD Performed: anesthesiologist   Preanesthetic Checklist Completed: patient identified, IV checked, risks and benefits discussed, monitors and equipment checked, pre-op evaluation and timeout performed  Epidural Patient position: sitting Prep: DuraPrep Patient monitoring: blood pressure and continuous pulse ox Approach: midline Location: L3-L4 Injection technique: LOR air  Needle:  Needle type: Tuohy  Needle gauge: 17 G Needle length: 9 cm Needle insertion depth: 5 cm Catheter size: 19 Gauge Catheter at skin depth: 10 cm Test dose: negative and Other (1% Lidocaine)  Additional Notes Patient identified.  Risk benefits discussed including failed block, incomplete pain control, headache, nerve damage, paralysis, blood pressure changes, nausea, vomiting, reactions to medication both toxic or allergic, and postpartum back pain.  Patient expressed understanding and wished to proceed.  All questions were answered.  Sterile technique used throughout procedure and epidural site dressed with sterile barrier dressing. No paresthesia or other complications noted. The patient did not experience any signs of intravascular injection such as tinnitus or metallic taste in mouth nor signs of intrathecal spread such as rapid motor block. Please see nursing notes for vital signs. Reason for block:procedure for pain

## 2019-07-09 NOTE — Progress Notes (Signed)
CTSP for bleeding.  Called by nurse that pt's uterus was diverted to side and they got her up to empty her bladder.  She tried to pass out on them when they stood her up.  They I&O cathed her for 700cc and had some clot expressed from uterus.   I ordered CBC and methergine.  VSS at that time.  Called again that blood loss total was then about 800 cc and pt was cramping badly.  I saw pt and her uterus was up to umbilicus.  Pt was c/o cramping but not feeling dizzy at that point.  There was a steady trickle of bright red blood from the vagina.    I ordered Stadol 1 mg for pain control and a dose of hemabate and pitocin to run in quickly.  I then did manual exploration of uterus and obtained about 1000cc of clot.  A final pass revealed normal endometrial cavity and no retained products.  The pt tolerated the procedure with some moderate pain.  The bleeding slowed to normal lochia.  A foley was placed and pt was placed on anemia precautions.    Vitals:   07/09/19 1615 07/09/19 1630 07/09/19 1700 07/09/19 1800  BP: 121/70 121/74 126/85 115/73  Pulse: 70 70 74 85  Resp:  18 18 17   Temp:   98.5 F (36.9 C) 98.9 F (37.2 C)  TempSrc:   Oral Oral  SpO2:      Weight:      Height:       Results for orders placed or performed during the hospital encounter of 07/09/19 (from the past 24 hour(s))  RPR     Status: None   Collection Time: 07/09/19  7:55 AM  Result Value Ref Range   RPR Ser Ql NON REACTIVE NON REACTIVE  Type and screen     Status: None (Preliminary result)   Collection Time: 07/09/19  7:55 AM  Result Value Ref Range   ABO/RH(D) O POS    Antibody Screen POS    Sample Expiration 07/12/2019,2359    Antibody Identification NO CLINICALLY SIGNIFICANT ANTIBODY IDENTIFIED    Unit Number 07/14/2019    Blood Component Type RED CELLS,LR    Unit division 00    Status of Unit ALLOCATED    Transfusion Status OK TO TRANSFUSE    Crossmatch Result COMPATIBLE    Unit Number [id number]     Blood Component Type RED CELLS,LR    Unit division 00    Status of Unit ALLOCATED    Transfusion Status OK TO TRANSFUSE    Crossmatch Result COMPATIBLE   Comprehensive metabolic panel     Status: Abnormal   Collection Time: 07/09/19  9:31 AM  Result Value Ref Range   Sodium 139 135 - 145 mmol/L   Potassium 4.2 3.5 - 5.1 mmol/L   Chloride 106 98 - 111 mmol/L   CO2 22 22 - 32 mmol/L   Glucose, Bld 85 70 - 99 mg/dL   BUN 7 6 - 20 mg/dL   Creatinine, Ser 07/11/19 0.44 - 1.00 mg/dL   Calcium 8.8 (L) 8.9 - 10.3 mg/dL   Total Protein 6.2 (L) 6.5 - 8.1 g/dL   Albumin 2.9 (L) 3.5 - 5.0 g/dL   AST 22 15 - 41 U/L   ALT 19 0 - 44 U/L   Alkaline Phosphatase 169 (H) 38 - 126 U/L   Total Bilirubin 0.5 0.3 - 1.2 mg/dL   GFR calc non Af Amer >60 >60 mL/min  GFR calc Af Amer >60 >60 mL/min   Anion gap 11 5 - 15  Uric acid     Status: None   Collection Time: 07/09/19  9:31 AM  Result Value Ref Range   Uric Acid, Serum 4.0 2.5 - 7.1 mg/dL  CBC     Status: Abnormal   Collection Time: 07/09/19 10:02 AM  Result Value Ref Range   WBC 8.8 4.0 - 10.5 K/uL   RBC 3.71 (L) 3.87 - 5.11 MIL/uL   Hemoglobin 10.9 (L) 12.0 - 15.0 g/dL   HCT 34.2 (L) 36.0 - 46.0 %   MCV 92.2 80.0 - 100.0 fL   MCH 29.4 26.0 - 34.0 pg   MCHC 31.9 30.0 - 36.0 g/dL   RDW 13.3 11.5 - 15.5 %   Platelets 240 150 - 400 K/uL   nRBC 0.0 0.0 - 0.2 %  CBC     Status: Abnormal   Collection Time: 07/09/19  6:50 PM  Result Value Ref Range   WBC 14.0 (H) 4.0 - 10.5 K/uL   RBC 3.50 (L) 3.87 - 5.11 MIL/uL   Hemoglobin 10.2 (L) 12.0 - 15.0 g/dL   HCT 31.2 (L) 36.0 - 46.0 %   MCV 89.1 80.0 - 100.0 fL   MCH 29.1 26.0 - 34.0 pg   MCHC 32.7 30.0 - 36.0 g/dL   RDW 13.2 11.5 - 15.5 %   Platelets 212 150 - 400 K/uL   nRBC 0.0 0.0 - 0.2 %    I rechecked pt about 45 minutes later and she was still cramping intensely but bleeding was still normal lochia and uterus was well below fundus.  SCDs were ordered and dilaudid for pain.  Her  epidural had been already removed.  Will check CBC in am.

## 2019-07-09 NOTE — Progress Notes (Signed)
Bedside US shows VTX, TRVS head L.   Clear fluid on AROM. Check PIH labs.

## 2019-07-09 NOTE — Transfer of Care (Signed)
Immediate Anesthesia Transfer of Care Note  Patient: Dominique Barnes  Procedure(s) Performed: VAGINAL DELIVERY IN OR (N/A Vagina )  Patient Location: L and D room 217  Anesthesia Type:Epidural  Level of Consciousness: awake, alert  and oriented  Airway & Oxygen Therapy: Patient Spontanous Breathing  Post-op Assessment: Report given to RN and Post -op Vital signs reviewed and stable  Post vital signs: Reviewed and stable HR 88, BP 93/62, SaO2 100%, T 97.5  Last Vitals:  Vitals Value Taken Time  BP    Temp    Pulse    Resp    SpO2      Last Pain:  Vitals:   07/09/19 1200  TempSrc: Oral  PainSc:          Complications: No apparent anesthesia complications

## 2019-07-10 ENCOUNTER — Encounter: Payer: Self-pay | Admitting: Anesthesiology

## 2019-07-10 LAB — CBC
HCT: 27.7 % — ABNORMAL LOW (ref 36.0–46.0)
Hemoglobin: 8.8 g/dL — ABNORMAL LOW (ref 12.0–15.0)
MCH: 28.8 pg (ref 26.0–34.0)
MCHC: 31.8 g/dL (ref 30.0–36.0)
MCV: 90.5 fL (ref 80.0–100.0)
Platelets: 195 10*3/uL (ref 150–400)
RBC: 3.06 MIL/uL — ABNORMAL LOW (ref 3.87–5.11)
RDW: 13.2 % (ref 11.5–15.5)
WBC: 11.8 10*3/uL — ABNORMAL HIGH (ref 4.0–10.5)
nRBC: 0 % (ref 0.0–0.2)

## 2019-07-10 MED ORDER — FERROUS SULFATE 325 (65 FE) MG PO TABS: 325.0000 mg | ORAL_TABLET | Freq: Every day | ORAL | Status: DC

## 2019-07-10 MED ORDER — FERROUS SULFATE 325 (65 FE) MG PO TABS
325.0000 mg | ORAL_TABLET | Freq: Every day | ORAL | Status: DC
  Administered 2019-07-11: 325 mg via ORAL
  Filled 2019-07-10: qty 1

## 2019-07-10 NOTE — Progress Notes (Signed)
Pt awake and has ordered food, will call out once she eats dinner so she can get her catheter out.

## 2019-07-10 NOTE — Progress Notes (Signed)
   07/10/19 0900  Orthostatic Lying   BP- Lying 124/84  Pulse- Lying 70  Orthostatic Sitting  BP- Sitting 109/54  Pulse- Sitting 76  Orthostatic Standing at 0 minutes  BP- Standing at 0 minutes 125/72  Pulse- Standing at 0 minutes 105  Orthostatic Standing at 3 minutes  BP- Standing at 3 minutes 119/80  Pulse- Standing at 3 minutes 85  Pt OOB to bathroom, tolerated well.  Denies dizziness, lightheadedness. Peri-care and catheter care performed. Pt back to bed, notified pt next time she gets up to call for help.

## 2019-07-10 NOTE — Progress Notes (Signed)
Patient is eating, ambulating, voiding.  Pain control is good.  Vitals:   07/09/19 2135 07/09/19 2255 07/10/19 0300 07/10/19 0649  BP: 127/71 137/86 120/75 130/81  Pulse: 64 60 (!) 58 62  Resp: 18 18 18 18   Temp:  97.7 F (36.5 C) 98.7 F (37.1 C) 98.4 F (36.9 C)  TempSrc:  Oral Oral Oral  SpO2: 96%  98% 98%  Weight:      Height:        Fundus firm Perineum without swelling.  Lab Results  Component Value Date   WBC 11.8 (H) 07/10/2019   HGB 8.8 (L) 07/10/2019   HCT 27.7 (L) 07/10/2019   MCV 90.5 07/10/2019   PLT 195 07/10/2019    --/--/O POS (03/19 0755)/RI  A/P Post partum day 1 twin delivery and pp hemorrhage.  H/H is stable.  Iron and anemia precautions.  Routine care.  Expect d/c tomorrow.    07-13-1999

## 2019-07-10 NOTE — Progress Notes (Signed)
MD called to notify this RN to take catheter out at 1700 after patient eats dinner.

## 2019-07-10 NOTE — Progress Notes (Signed)
Pt asleep in bed, will notify patient to order food during next check. Pt wanted to sleep and not be disturbed.

## 2019-07-10 NOTE — Discharge Summary (Signed)
Obstetric Discharge Summary Reason for Admission: induction of labor Prenatal Procedures: NST and ultrasound Intrapartum Procedures: spontaneous vaginal delivery Postpartum Procedures: none Complications-Operative and Postpartum: hemorrhage Hemoglobin  Date Value Ref Range Status  07/10/2019 8.8 (L) 12.0 - 15.0 g/dL Final   HCT  Date Value Ref Range Status  07/10/2019 27.7 (L) 36.0 - 46.0 % Final    Discharge Diagnoses: Term Pregnancy-delivered and twins, pp hemorrhage  Discharge Information: Date: 07/10/2019 Activity: pelvic rest Diet: routine Medications: Ibuprofen, Colace and Iron Condition: stable Instructions: refer to practice specific booklet Discharge to: home Follow-up Information    Carrington Clamp, MD Follow up in 4 week(s).   Specialty: Obstetrics and Gynecology Contact information: [address] [place] [address] [phone]           Newborn Data:   Amethyst, Gainer [[id number]]  Live born female  Birth Weight: 5 lb 9.1 oz (2525 g) APGAR: 8, 9  Newborn Delivery   Birth date/time: 07/09/2019 14:27:00 Delivery type: Vaginal, Spontaneous       Ennifer, Harston [[id number]]  Live born female  Birth Weight: 6 lb 5.2 oz (2870 g) APGAR: 7, 9  Newborn Delivery   Birth date/time: 07/09/2019 14:32:00 Delivery type: Vaginal, Spontaneous      Home with mother.  Loney Laurence 07/10/2019, 9:51 AM

## 2019-07-10 NOTE — Anesthesia Postprocedure Evaluation (Signed)
Anesthesia Post Note  Patient: Dominique Barnes  Procedure(s) Performed: VAGINAL DELIVERY IN OR (N/A Vagina )     Patient location during evaluation: Mother Baby Anesthesia Type: Epidural Level of consciousness: awake and alert, oriented and patient cooperative Pain management: pain level controlled Vital Signs Assessment: post-procedure vital signs reviewed and stable Respiratory status: spontaneous breathing Cardiovascular status: stable Postop Assessment: no headache, epidural receding, patient able to bend at knees and no signs of nausea or vomiting Anesthetic complications: no Comments: Pt.  States she is bending both knees 90 degrees and has no residual numbness or tingling.  Pain score 2.      Last Vitals:  Vitals:   07/10/19 0300 07/10/19 0649  BP: 120/75 130/81  Pulse: (!) 58 62  Resp: 18 18  Temp: 37.1 C 36.9 C  SpO2: 98% 98%    Last Pain:  Vitals:   07/10/19 0649  TempSrc: Oral  PainSc: 0-No pain   Pain Goal:                   [facility]

## 2019-07-13 LAB — BPAM RBC
Blood Product Expiration Date: 202104202359
Blood Product Expiration Date: 202104222359
ISSUE DATE / TIME: 202103190756
ISSUE DATE / TIME: 202103190756
Unit Type and Rh: 5100
Unit Type and Rh: 5100

## 2019-07-13 LAB — TYPE AND SCREEN
ABO/RH(D): O POS
Antibody Screen: POSITIVE
Unit division: 0
Unit division: 0

## 2019-07-13 LAB — SURGICAL PATHOLOGY

## 2021-04-22 NOTE — L&D Delivery Note (Addendum)
Delivery Note MARIGNY BORRE is a G3P2003 at [redacted]w[redacted]d with DCDA twins (vertex/complete breech) was was admitted for induction of labor for chronic hypertension. Patient counseled on risks of twin delivery with malpresentation of the second twin, including inability to complete breech extraction, head entrapment, injury, emergent cesarean delivery. Alternative to vaginal delivery is planned cesarean delivery. Patient had successful vertex/breech twin delivery in 2021 and wished to proceed.   Induced with pitocin and AROM of amniotic sac A. Progressed normally. Received epidural for pain management. Pushed for 1 hr 20 minutes.     Lejla, Moeser [[id number]]  At 8:20 PM a viable female was delivered via Vaginal, Spontaneous (Presentation: ROA  ).  APGAR: 9, 9; weight 7lb1.2oz (3210g) 60 second delayed cord clamping.     Ayona, Yniguez [[id number]]  At 8:27 PM a viable female was delivered via Vaginal,  complete breech).  APGAR: 3, 9; weight 6lb9.1oz (0174B)    After delivery of baby A, normal FH for baby B was noted. Both of infant's feet were grasped and brought to the introitus. The amniotic sac was ruptured. Using standard breech maneuvers, maternal effort brought the infant down to the level of the scapula. The infant's right arm was delivered with a gentle sweeping motion. The infant's left arm was noted to be in a nuchal position in the uterus. The infant was rotated and attempt to sweep arm down was initially unsuccessful, then resulted in a left humerus fracture and delivery of the left arm. With maternal effort and the Mauriceau-Smellie-Veit maneuver, the infant's head was delivered. Due to poor tone, the cord was cut immediately and the infant was taken to the bedside warmer for respiratory support from NICU team. Cord gases collected.   Both placentas delivered spontaneously, clamp on cord B. 3 vessel cords x 2 noted.  Placenta status: to L&D. Fundus firm. 1st degree  perineal laceration repaired with 3-0 vicryl suture. Bimanual exam performed with clots expressed from lower uterine segment. TXA and misoprostol 1072mcg given per rectum with good response. DRE with good rectal tone and no sutures. Estimated blood loss 600cc.  Instrument and gauze counts were correct at the end of the procedure.  Anesthesia:  epidural Episiotomy: None Lacerations: 1st degree Suture Repair: 3.0 vicryl Est. Blood Loss (mL): 600  Mom to postpartum.   Baby A to Couplet care / Skin to Skin.   Baby B to Couplet care / Skin to Skin.  XRAY obtained for baby B - confirmed left humerus fracture. Dr. Genice Rouge (NICU) spoke with parents with me present. Will maintain arm in neutral position and PT will see baby tomorrow. Prognosis for spontaneous healing is very good.   Rowland Lathe 02/08/2022, 12:06 AM

## 2021-07-30 LAB — OB RESULTS CONSOLE GC/CHLAMYDIA
Chlamydia: NEGATIVE
Neisseria Gonorrhea: NEGATIVE

## 2021-07-30 LAB — HEPATITIS C ANTIBODY: HCV Ab: NEGATIVE

## 2021-07-30 LAB — OB RESULTS CONSOLE RUBELLA ANTIBODY, IGM: Rubella: IMMUNE

## 2021-07-30 LAB — OB RESULTS CONSOLE RPR: RPR: NONREACTIVE

## 2021-07-30 LAB — OB RESULTS CONSOLE HEPATITIS B SURFACE ANTIGEN: Hepatitis B Surface Ag: NEGATIVE

## 2021-07-30 LAB — OB RESULTS CONSOLE HIV ANTIBODY (ROUTINE TESTING): HIV: NONREACTIVE

## 2021-07-30 LAB — OB RESULTS CONSOLE ANTIBODY SCREEN: Antibody Screen: NEGATIVE

## 2021-09-04 ENCOUNTER — Other Ambulatory Visit: Payer: Self-pay | Admitting: Obstetrics and Gynecology

## 2021-09-04 DIAGNOSIS — Z363 Encounter for antenatal screening for malformations: Secondary | ICD-10-CM

## 2021-09-26 ENCOUNTER — Encounter: Payer: Self-pay | Admitting: *Deleted

## 2021-10-02 ENCOUNTER — Ambulatory Visit (HOSPITAL_BASED_OUTPATIENT_CLINIC_OR_DEPARTMENT_OTHER): Payer: Self-pay | Admitting: Obstetrics

## 2021-10-02 ENCOUNTER — Ambulatory Visit: Payer: Self-pay | Admitting: *Deleted

## 2021-10-02 ENCOUNTER — Ambulatory Visit: Payer: Self-pay | Attending: Obstetrics and Gynecology

## 2021-10-02 ENCOUNTER — Encounter: Payer: Self-pay | Admitting: *Deleted

## 2021-10-02 ENCOUNTER — Other Ambulatory Visit: Payer: Self-pay | Admitting: *Deleted

## 2021-10-02 VITALS — BP 119/65 | HR 78

## 2021-10-02 DIAGNOSIS — Z363 Encounter for antenatal screening for malformations: Secondary | ICD-10-CM

## 2021-10-02 DIAGNOSIS — O30042 Twin pregnancy, dichorionic/diamniotic, second trimester: Secondary | ICD-10-CM

## 2021-10-02 DIAGNOSIS — Z3A20 20 weeks gestation of pregnancy: Secondary | ICD-10-CM

## 2021-10-02 DIAGNOSIS — O10912 Unspecified pre-existing hypertension complicating pregnancy, second trimester: Secondary | ICD-10-CM

## 2021-10-02 NOTE — Progress Notes (Signed)
MFM Note  Dominique Barnes was seen due to a spontaneously conceived twin pregnancy.  She has a history of chronic hypertension that is not treated with any medications.  She has a history of a prior dichorionic, diamniotic twin pregnancy.  She has declined all screening tests for fetal aneuploidy in her pregnancy.  A thick dividing membrane was noted separating the two fetuses, indicating that these are dichorionic, diamniotic twins.  The fetal growth and amniotic fluid level appeared appropriate for both twin A and twin B.  The fetal biometry measurements confirm an [facility] of February 19, 2022.  There were no obvious anomalies noted in either twin A or twin B today.  However the views of the fetal anatomy were limited today due to the fetal positions.  The limitations of ultrasound in the detection of all anomalies was discussed today.  The management of dichorionic twins was discussed.  She was advised that management of twin pregnancies will involve frequent ultrasound exams to assess the fetal growth and amniotic fluid level. We will continue to follow her with serial growth ultrasounds.    Weekly fetal testing for dichorionic twins should start at around 34 weeks.    Delivery for uncomplicated dichorionic twins should occur at around 28 weeks.  The increased risk of preeclampsia, gestational diabetes, and preterm birth/labor associated with twin pregnancies was discussed.   As pregnancies with multiple gestations are at increased risk for developing preeclampsia, she was advised to take a daily baby aspirin (81 mg per day) to decrease her risk of developing preeclampsia.  She was offered and declined a cell free DNA test today to screen for fetal aneuploidy and to determine the zygosity of the fetuses.  A follow-up exam was scheduled in 4 weeks to assess the fetal growth and to complete the views of the fetal anatomy.   The patient stated that all of her questions were answered  today.  A total of 30 minutes was spent counseling and coordinating the care for this patient.  Greater than 50% of the time was spent in direct face-to-face contact.

## 2021-10-30 ENCOUNTER — Other Ambulatory Visit: Payer: Self-pay | Admitting: *Deleted

## 2021-10-30 ENCOUNTER — Ambulatory Visit: Payer: 59 | Admitting: *Deleted

## 2021-10-30 ENCOUNTER — Ambulatory Visit: Payer: 59 | Attending: Obstetrics and Gynecology

## 2021-10-30 VITALS — BP 121/76 | HR 88

## 2021-10-30 DIAGNOSIS — O09892 Supervision of other high risk pregnancies, second trimester: Secondary | ICD-10-CM | POA: Diagnosis not present

## 2021-10-30 DIAGNOSIS — O30042 Twin pregnancy, dichorionic/diamniotic, second trimester: Secondary | ICD-10-CM | POA: Diagnosis present

## 2021-10-30 DIAGNOSIS — O10012 Pre-existing essential hypertension complicating pregnancy, second trimester: Secondary | ICD-10-CM

## 2021-10-30 DIAGNOSIS — O10912 Unspecified pre-existing hypertension complicating pregnancy, second trimester: Secondary | ICD-10-CM

## 2021-10-30 DIAGNOSIS — Z3A24 24 weeks gestation of pregnancy: Secondary | ICD-10-CM | POA: Diagnosis not present

## 2021-11-30 ENCOUNTER — Ambulatory Visit: Payer: 59

## 2021-11-30 ENCOUNTER — Ambulatory Visit: Payer: 59 | Attending: Maternal & Fetal Medicine

## 2022-01-03 ENCOUNTER — Non-Acute Institutional Stay (HOSPITAL_COMMUNITY)
Admission: RE | Admit: 2022-01-03 | Discharge: 2022-01-03 | Disposition: A | Discharge status: Home / Self Care | Payer: 59 | Source: Ambulatory Visit | Attending: Internal Medicine | Admitting: Internal Medicine

## 2022-01-03 DIAGNOSIS — O99019 Anemia complicating pregnancy, unspecified trimester: Secondary | ICD-10-CM | POA: Insufficient documentation

## 2022-01-03 MED ORDER — SODIUM CHLORIDE 0.9 % IV SOLN: INTRAVENOUS | Status: DC | PRN

## 2022-01-03 MED ORDER — SODIUM CHLORIDE 0.9 % IV SOLN
510.0000 mg | Freq: Once | INTRAVENOUS | Status: AC
  Administered 2022-01-03: 510 mg via INTRAVENOUS
  Filled 2022-01-03: qty 17

## 2022-01-03 NOTE — Progress Notes (Signed)
PATIENT CARE CENTER NOTE  Diagnosis: Anemia complicating pregnancy, unspecified trimester O99.019   Provider: Waynard Reeds, MD   Procedure: Feraheme infusion    Note:  Patient received 510 mg Feraheme infusion (dose # 1 of 2) via PIV. Patient tolerated infusion well with no adverse reaction. Observed patient for 30 minutes post infusion. Vital signs stable. Discharge instructions given. Patient to come back next week for second infusion and will schedule appointment at the front desk. Alert, oriented and ambulatory at discharge.

## 2022-01-11 ENCOUNTER — Non-Acute Institutional Stay (HOSPITAL_COMMUNITY)
Admission: RE | Admit: 2022-01-11 | Discharge: 2022-01-11 | Disposition: A | Discharge status: Home / Self Care | Payer: 59 | Source: Ambulatory Visit | Attending: Internal Medicine | Admitting: Internal Medicine

## 2022-01-11 DIAGNOSIS — O99019 Anemia complicating pregnancy, unspecified trimester: Secondary | ICD-10-CM | POA: Insufficient documentation

## 2022-01-11 MED ORDER — SODIUM CHLORIDE 0.9 % IV SOLN: INTRAVENOUS | Status: DC | PRN

## 2022-01-11 MED ORDER — SODIUM CHLORIDE 0.9 % IV SOLN
510.0000 mg | Freq: Once | INTRAVENOUS | Status: AC
  Administered 2022-01-11: 510 mg via INTRAVENOUS
  Filled 2022-01-11: qty 17

## 2022-01-11 NOTE — Progress Notes (Signed)
PATIENT CARE CENTER NOTE   Diagnosis: Anemia complicating pregnancy, unspecified trimester O99.019     Provider: Vanessa Kick, MD     Procedure: Feraheme infusion      Note:  Patient received 510 mg Feraheme infusion (dose # 2 of 2) via PIV. Patient tolerated infusion well with no adverse reaction. Observed patient for 30 minutes post infusion. Vital signs stable. AVS offered but patient refused. Patient alert, oriented and ambulatory at discharge.

## 2022-01-24 LAB — OB RESULTS CONSOLE GBS: GBS: NEGATIVE

## 2022-01-29 ENCOUNTER — Inpatient Hospital Stay (HOSPITAL_COMMUNITY)
Admission: AD | Admit: 2022-01-29 | Discharge: 2022-01-29 | Disposition: A | Discharge status: Home / Self Care | Payer: 59 | Attending: Obstetrics and Gynecology | Admitting: Obstetrics and Gynecology

## 2022-01-29 ENCOUNTER — Encounter (HOSPITAL_COMMUNITY): Payer: Self-pay | Admitting: Obstetrics and Gynecology

## 2022-01-29 ENCOUNTER — Telehealth (HOSPITAL_COMMUNITY): Payer: Self-pay | Admitting: *Deleted

## 2022-01-29 ENCOUNTER — Other Ambulatory Visit: Payer: Self-pay

## 2022-01-29 DIAGNOSIS — O30043 Twin pregnancy, dichorionic/diamniotic, third trimester: Secondary | ICD-10-CM

## 2022-01-29 DIAGNOSIS — Z3689 Encounter for other specified antenatal screening: Secondary | ICD-10-CM | POA: Diagnosis not present

## 2022-01-29 DIAGNOSIS — Z3A37 37 weeks gestation of pregnancy: Secondary | ICD-10-CM | POA: Diagnosis not present

## 2022-01-29 DIAGNOSIS — O368330 Maternal care for abnormalities of the fetal heart rate or rhythm, third trimester, not applicable or unspecified: Secondary | ICD-10-CM | POA: Insufficient documentation

## 2022-01-29 DIAGNOSIS — O10013 Pre-existing essential hypertension complicating pregnancy, third trimester: Secondary | ICD-10-CM | POA: Diagnosis not present

## 2022-01-29 DIAGNOSIS — Z7982 Long term (current) use of aspirin: Secondary | ICD-10-CM | POA: Insufficient documentation

## 2022-01-29 NOTE — Telephone Encounter (Signed)
Preadmission screen  

## 2022-01-29 NOTE — MAU Provider Note (Signed)
History     CSN: [id number]  Arrival date and time: 01/29/22 1156   Event Date/Time   First Provider Initiated Contact with Patient 01/29/22 1243      Chief Complaint  Patient presents with   Fetal Monitoring   31 y.o. G3P2003 @37 .0 wks sent from office for prolonged monitoring for suspected FHR decel on NST today. Reports good FM. Denies ctx, VB, LOF. Pregnancy is complicated by didi twins, CHTN not on meds, and anemia. Fetal weight is concordant and position is vtx/breech as of yesterday.    OB History     Gravida  3   Para  2   Term  2   Preterm      AB      Living  3      SAB      IAB      Ectopic      Multiple  1   Live Births  3           Past Medical History:  Diagnosis Date   Hypertension     Past Surgical History:  Procedure Laterality Date   ANKLE SURGERY     CESAREAN SECTION N/A 07/09/2019   Procedure: VAGINAL DELIVERY IN OR;  Surgeon: 07/11/2019, MD;  Location: [facility] LD ORS;  Service: Obstetrics;  Laterality: N/A;   NOSE SURGERY      Family History  Problem Relation Age of Onset   Heart disease Mother    Heart attack Mother    Hypertension Paternal Aunt    Diabetes Maternal Great-grandfather    Cancer Maternal Great-grandfather    Cancer Other    Asthma Neg Hx    Stroke Neg Hx     Social History   Tobacco Use   Smoking status: Never   Smokeless tobacco: Never  Vaping Use   Vaping Use: Never used  Substance Use Topics   Alcohol use: Not Currently    Comment: not while preg   Drug use: Never    Allergies: No Known Allergies  Medications Prior to Admission  Medication Sig Dispense Refill Last Dose   aspirin EC 81 MG tablet Take 81 mg by mouth daily. Swallow whole.   01/29/2022 at 0900   Prenatal Vit-Fe Fumarate-FA (PRENATAL VITAMINS PO) Take by mouth.   01/29/2022 at 0900    Review of Systems  Gastrointestinal:  Negative for abdominal pain.  Genitourinary:  Negative for vaginal bleeding and vaginal  discharge.   Physical Exam   Blood pressure 121/84, pulse (!) 107, temperature 98.7 F (37.1 C), temperature source Oral, last menstrual period 05/15/2021, SpO2 98 %, unknown if currently breastfeeding.  Physical Exam Constitutional:      General: She is not in acute distress.    Appearance: Normal appearance.  HENT:     Head: Normocephalic and atraumatic.  Cardiovascular:     Rate and Rhythm: Normal rate.  Pulmonary:     Effort: Pulmonary effort is normal. No respiratory distress.  Musculoskeletal:        General: Normal range of motion.  Neurological:     General: No focal deficit present.     Mental Status: She is alert and oriented to person, place, and time.  Psychiatric:        Mood and Affect: Mood normal.        Behavior: Behavior normal.   EFM-A: 140 bpm, mod variability, + accels, no decels EFM-B: 135 bpm, mod variability, + accels, no decels Toco: rare  No results  found for this or any previous visit (from the past 24 hour(s)).  MAU Course  Procedures  MDM Prenatal records reviewed. NST x2 reactive, no decels. Stable for discharge home. Dr. Terri Piedra to send message to office for f/u.  Assessment and Plan   1. [redacted] weeks gestation of pregnancy   2. NST (non-stress test) reactive   3. Dichorionic diamniotic twin pregnancy in third trimester    Discharge home Follow up at [facility] OB this week [facility] Labor precautions  Allergies as of 01/29/2022   No Known Allergies      Medication List     TAKE these medications    aspirin EC 81 MG tablet Take 81 mg by mouth daily. Swallow whole.   PRENATAL VITAMINS PO Take by mouth.        Julianne Handler, CNM 01/29/2022, 12:48 PM

## 2022-01-29 NOTE — MAU Note (Signed)
.  Dominique Barnes is a 31 y.o. at [redacted]w[redacted]d here in MAU reporting: sent from office for fetal monitoring.  Denies VB or LOF.   LMP: N/A Onset of complaint: today Pain score: 0 There were no vitals filed for this visit.   OIN:OMVEHMCN Lab orders placed from triage:   None

## 2022-01-31 ENCOUNTER — Encounter (HOSPITAL_COMMUNITY): Payer: Self-pay | Admitting: *Deleted

## 2022-01-31 ENCOUNTER — Telehealth (HOSPITAL_COMMUNITY): Payer: Self-pay | Admitting: *Deleted

## 2022-01-31 NOTE — Telephone Encounter (Signed)
Preadmission screen  

## 2022-02-07 ENCOUNTER — Encounter (HOSPITAL_COMMUNITY)
Admission: AD | Disposition: A | Discharge status: Home / Self Care | Payer: Self-pay | Source: Home / Self Care | Attending: Obstetrics and Gynecology

## 2022-02-07 ENCOUNTER — Inpatient Hospital Stay (HOSPITAL_COMMUNITY): Payer: 59 | Admitting: Anesthesiology

## 2022-02-07 ENCOUNTER — Inpatient Hospital Stay (HOSPITAL_COMMUNITY): Payer: 59

## 2022-02-07 ENCOUNTER — Other Ambulatory Visit: Payer: Self-pay

## 2022-02-07 ENCOUNTER — Inpatient Hospital Stay (HOSPITAL_COMMUNITY)
Admission: AD | Admit: 2022-02-07 | Discharge: 2022-02-09 | DRG: 807 | Disposition: A | Discharge status: Home / Self Care | Payer: 59 | Attending: Obstetrics and Gynecology | Admitting: Obstetrics and Gynecology

## 2022-02-07 ENCOUNTER — Encounter (HOSPITAL_COMMUNITY): Payer: Self-pay | Admitting: Obstetrics and Gynecology

## 2022-02-07 DIAGNOSIS — O34219 Maternal care for unspecified type scar from previous cesarean delivery: Secondary | ICD-10-CM | POA: Diagnosis present

## 2022-02-07 DIAGNOSIS — O321XX2 Maternal care for breech presentation, fetus 2: Secondary | ICD-10-CM | POA: Diagnosis present

## 2022-02-07 DIAGNOSIS — O1002 Pre-existing essential hypertension complicating childbirth: Principal | ICD-10-CM | POA: Diagnosis present

## 2022-02-07 DIAGNOSIS — Z3A38 38 weeks gestation of pregnancy: Secondary | ICD-10-CM

## 2022-02-07 DIAGNOSIS — Z8249 Family history of ischemic heart disease and other diseases of the circulatory system: Secondary | ICD-10-CM | POA: Diagnosis not present

## 2022-02-07 DIAGNOSIS — O30001 Twin pregnancy, unspecified number of placenta and unspecified number of amniotic sacs, first trimester: Principal | ICD-10-CM

## 2022-02-07 DIAGNOSIS — O164 Unspecified maternal hypertension, complicating childbirth: Secondary | ICD-10-CM

## 2022-02-07 DIAGNOSIS — O30043 Twin pregnancy, dichorionic/diamniotic, third trimester: Secondary | ICD-10-CM | POA: Diagnosis present

## 2022-02-07 DIAGNOSIS — O30009 Twin pregnancy, unspecified number of placenta and unspecified number of amniotic sacs, unspecified trimester: Secondary | ICD-10-CM | POA: Diagnosis present

## 2022-02-07 LAB — CBC
HCT: 39.5 % (ref 36.0–46.0)
Hemoglobin: 12.7 g/dL (ref 12.0–15.0)
MCH: 28.4 pg (ref 26.0–34.0)
MCHC: 32.2 g/dL (ref 30.0–36.0)
MCV: 88.4 fL (ref 80.0–100.0)
Platelets: 238 10*3/uL (ref 150–400)
RBC: 4.47 MIL/uL (ref 3.87–5.11)
RDW: 21.8 % — ABNORMAL HIGH (ref 11.5–15.5)
WBC: 6.8 10*3/uL (ref 4.0–10.5)
nRBC: 0 % (ref 0.0–0.2)

## 2022-02-07 LAB — TYPE AND SCREEN
ABO/RH(D): O POS
Antibody Screen: NEGATIVE

## 2022-02-07 LAB — RPR: RPR Ser Ql: NONREACTIVE

## 2022-02-07 SURGERY — Surgical Case
Anesthesia: Epidural

## 2022-02-07 MED ORDER — IBUPROFEN 600 MG PO TABS
600.0000 mg | ORAL_TABLET | Freq: Four times a day (QID) | ORAL | Status: DC
  Administered 2022-02-08 – 2022-02-09 (×3): 600 mg via ORAL
  Filled 2022-02-07 (×5): qty 1

## 2022-02-07 MED ORDER — EPHEDRINE 5 MG/ML INJ: 10.0000 mg | INTRAVENOUS | Status: DC | PRN

## 2022-02-07 MED ORDER — TERBUTALINE SULFATE 1 MG/ML IJ SOLN: 0.2500 mg | Freq: Once | INTRAMUSCULAR | Status: DC | PRN

## 2022-02-07 MED ORDER — LIDOCAINE HCL (PF) 1 % IJ SOLN
INTRAMUSCULAR | Status: DC | PRN
  Administered 2022-02-07: 10 mL via EPIDURAL
  Administered 2022-02-07: 2 mL via EPIDURAL

## 2022-02-07 MED ORDER — MISOPROSTOL 200 MCG PO TABS
ORAL_TABLET | ORAL | Status: AC
  Filled 2022-02-07: qty 1

## 2022-02-07 MED ORDER — FENTANYL CITRATE (PF) 100 MCG/2ML IJ SOLN: 50.0000 ug | INTRAMUSCULAR | Status: DC | PRN

## 2022-02-07 MED ORDER — WITCH HAZEL-GLYCERIN EX PADS: 1.0000 | MEDICATED_PAD | CUTANEOUS | Status: DC | PRN

## 2022-02-07 MED ORDER — OXYCODONE-ACETAMINOPHEN 5-325 MG PO TABS: 2.0000 | ORAL_TABLET | ORAL | Status: DC | PRN

## 2022-02-07 MED ORDER — OXYTOCIN-SODIUM CHLORIDE 30-0.9 UT/500ML-% IV SOLN
2.5000 [IU]/h | INTRAVENOUS | Status: DC
  Administered 2022-02-07: 6 m[IU]/min via INTRAVENOUS

## 2022-02-07 MED ORDER — LIDOCAINE HCL (PF) 1 % IJ SOLN: 30.0000 mL | INTRAMUSCULAR | Status: DC | PRN

## 2022-02-07 MED ORDER — COCONUT OIL OIL: 1.0000 | TOPICAL_OIL | Status: DC | PRN

## 2022-02-07 MED ORDER — OXYCODONE HCL 5 MG PO TABS: 10.0000 mg | ORAL_TABLET | ORAL | Status: DC | PRN

## 2022-02-07 MED ORDER — PHENYLEPHRINE 80 MCG/ML (10ML) SYRINGE FOR IV PUSH (FOR BLOOD PRESSURE SUPPORT): 80.0000 ug | PREFILLED_SYRINGE | INTRAVENOUS | Status: DC | PRN

## 2022-02-07 MED ORDER — SOD CITRATE-CITRIC ACID 500-334 MG/5ML PO SOLN: 30.0000 mL | ORAL | Status: DC | PRN

## 2022-02-07 MED ORDER — FENTANYL-BUPIVACAINE-NACL 0.5-0.125-0.9 MG/250ML-% EP SOLN
12.0000 mL/h | EPIDURAL | Status: DC | PRN
  Filled 2022-02-07: qty 250

## 2022-02-07 MED ORDER — LACTATED RINGERS IV SOLN: 500.0000 mL | INTRAVENOUS | Status: DC | PRN

## 2022-02-07 MED ORDER — TRANEXAMIC ACID 1000 MG/10ML IV SOLN
INTRAVENOUS | Status: DC | PRN
  Administered 2022-02-07: 1000 mg via INTRAVENOUS

## 2022-02-07 MED ORDER — ONDANSETRON HCL 4 MG/2ML IJ SOLN
4.0000 mg | Freq: Four times a day (QID) | INTRAMUSCULAR | Status: DC | PRN
  Administered 2022-02-07: 4 mg via INTRAVENOUS

## 2022-02-07 MED ORDER — BISACODYL 10 MG RE SUPP: 10.0000 mg | Freq: Every day | RECTAL | Status: DC | PRN

## 2022-02-07 MED ORDER — ONDANSETRON HCL 4 MG PO TABS: 4.0000 mg | ORAL_TABLET | ORAL | Status: DC | PRN

## 2022-02-07 MED ORDER — TETANUS-DIPHTH-ACELL PERTUSSIS 5-2.5-18.5 LF-MCG/0.5 IM SUSY: 0.5000 mL | PREFILLED_SYRINGE | Freq: Once | INTRAMUSCULAR | Status: DC

## 2022-02-07 MED ORDER — DEXAMETHASONE SODIUM PHOSPHATE 10 MG/ML IJ SOLN
INTRAMUSCULAR | Status: DC | PRN
  Administered 2022-02-07: 10 mg via INTRAVENOUS

## 2022-02-07 MED ORDER — LACTATED RINGERS IV SOLN: INTRAVENOUS | Status: DC

## 2022-02-07 MED ORDER — DIPHENHYDRAMINE HCL 25 MG PO CAPS: 25.0000 mg | ORAL_CAPSULE | Freq: Four times a day (QID) | ORAL | Status: DC | PRN

## 2022-02-07 MED ORDER — PRENATAL MULTIVITAMIN CH
1.0000 | ORAL_TABLET | Freq: Every day | ORAL | Status: DC
  Administered 2022-02-09: 1 via ORAL
  Filled 2022-02-07: qty 1

## 2022-02-07 MED ORDER — BENZOCAINE-MENTHOL 20-0.5 % EX AERO
1.0000 | INHALATION_SPRAY | CUTANEOUS | Status: DC | PRN
  Administered 2022-02-08: 1 via TOPICAL
  Filled 2022-02-07: qty 56

## 2022-02-07 MED ORDER — ACETAMINOPHEN 325 MG PO TABS
650.0000 mg | ORAL_TABLET | ORAL | Status: DC | PRN
  Administered 2022-02-09: 650 mg via ORAL
  Filled 2022-02-07: qty 2

## 2022-02-07 MED ORDER — DIBUCAINE (PERIANAL) 1 % EX OINT: 1.0000 | TOPICAL_OINTMENT | CUTANEOUS | Status: DC | PRN

## 2022-02-07 MED ORDER — OXYTOCIN-SODIUM CHLORIDE 30-0.9 UT/500ML-% IV SOLN
INTRAVENOUS | Status: AC
  Administered 2022-02-07: 2 m[IU]/min via INTRAVENOUS
  Filled 2022-02-07: qty 500

## 2022-02-07 MED ORDER — OXYTOCIN BOLUS FROM INFUSION
333.0000 mL | Freq: Once | INTRAVENOUS | Status: AC
  Administered 2022-02-07: 333 mL via INTRAVENOUS

## 2022-02-07 MED ORDER — ONDANSETRON HCL 4 MG/2ML IJ SOLN: 4.0000 mg | INTRAMUSCULAR | Status: DC | PRN

## 2022-02-07 MED ORDER — FLEET ENEMA 7-19 GM/118ML RE ENEM: 1.0000 | ENEMA | Freq: Every day | RECTAL | Status: DC | PRN

## 2022-02-07 MED ORDER — TRANEXAMIC ACID-NACL 1000-0.7 MG/100ML-% IV SOLN: 1000.0000 mg | INTRAVENOUS | Status: DC

## 2022-02-07 MED ORDER — TRANEXAMIC ACID-NACL 1000-0.7 MG/100ML-% IV SOLN
INTRAVENOUS | Status: AC
  Filled 2022-02-07: qty 100

## 2022-02-07 MED ORDER — LACTATED RINGERS IV SOLN
500.0000 mL | Freq: Once | INTRAVENOUS | Status: AC
  Administered 2022-02-07: 500 mL via INTRAVENOUS

## 2022-02-07 MED ORDER — DOCUSATE SODIUM 100 MG PO CAPS
100.0000 mg | ORAL_CAPSULE | Freq: Two times a day (BID) | ORAL | Status: DC
  Administered 2022-02-08 – 2022-02-09 (×3): 100 mg via ORAL
  Filled 2022-02-07 (×3): qty 1

## 2022-02-07 MED ORDER — ACETAMINOPHEN 325 MG PO TABS: 650.0000 mg | ORAL_TABLET | ORAL | Status: DC | PRN

## 2022-02-07 MED ORDER — OXYCODONE-ACETAMINOPHEN 5-325 MG PO TABS: 1.0000 | ORAL_TABLET | ORAL | Status: DC | PRN

## 2022-02-07 MED ORDER — SIMETHICONE 80 MG PO CHEW: 80.0000 mg | CHEWABLE_TABLET | ORAL | Status: DC | PRN

## 2022-02-07 MED ORDER — DIPHENHYDRAMINE HCL 50 MG/ML IJ SOLN: 12.5000 mg | INTRAMUSCULAR | Status: DC | PRN

## 2022-02-07 MED ORDER — OXYCODONE HCL 5 MG PO TABS: 5.0000 mg | ORAL_TABLET | ORAL | Status: DC | PRN

## 2022-02-07 MED ORDER — MISOPROSTOL 200 MCG PO TABS
800.0000 ug | ORAL_TABLET | Freq: Once | ORAL | Status: AC
  Administered 2022-02-07: 800 ug via RECTAL

## 2022-02-07 MED ORDER — OXYTOCIN-SODIUM CHLORIDE 30-0.9 UT/500ML-% IV SOLN: 1.0000 m[IU]/min | INTRAVENOUS | Status: DC

## 2022-02-07 MED ORDER — FENTANYL-BUPIVACAINE-NACL 0.5-0.125-0.9 MG/250ML-% EP SOLN
EPIDURAL | Status: DC | PRN
  Administered 2022-02-07: 12 mL/h via EPIDURAL

## 2022-02-07 NOTE — Anesthesia Preprocedure Evaluation (Signed)
Anesthesia Evaluation  Patient identified by MRN, date of birth, ID band Patient awake    Reviewed: Allergy & Precautions, Patient's Chart, lab work & pertinent test results  Airway Mallampati: II  TM Distance: >3 FB Neck ROM: Full    Dental no notable dental hx.    Pulmonary neg pulmonary ROS,    Pulmonary exam normal breath sounds clear to auscultation       Cardiovascular hypertension, Normal cardiovascular exam Rhythm:Regular Rate:Normal     Neuro/Psych negative neurological ROS  negative psych ROS   GI/Hepatic negative GI ROS, Neg liver ROS,   Endo/Other  negative endocrine ROS  Renal/GU negative Renal ROS  negative genitourinary   Musculoskeletal negative musculoskeletal ROS (+)   Abdominal   Peds negative pediatric ROS (+)  Hematology negative hematology ROS (+) Hb 12.7, plt 238   Anesthesia Other Findings   Reproductive/Obstetrics (+) Pregnancy 3rd delivery, 1 prior set of twins delivered vaginally 1 prior section 2021                             Anesthesia Physical Anesthesia Plan  ASA: 2  Anesthesia Plan: Epidural   Post-op Pain Management:    Induction:   PONV Risk Score and Plan: 2  Airway Management Planned: Natural Airway  Additional Equipment: None  Intra-op Plan:   Post-operative Plan:   Informed Consent: I have reviewed the patients History and Physical, chart, labs and discussed the procedure including the risks, benefits and alternatives for the proposed anesthesia with the patient or authorized representative who has indicated his/her understanding and acceptance.       Plan Discussed with:   Anesthesia Plan Comments:         Anesthesia Quick Evaluation

## 2022-02-07 NOTE — Lactation Note (Signed)
This note was copied from a baby's chart. Lactation Consultation Note  Patient Name: Dominique Barnes [id number] Date: 02/07/2022   Age:31 hours Mom's choice of feeding is formula.  Maternal Data    Feeding    LATCH Score                    Lactation Tools Discussed/Used    Interventions    Discharge    Consult Status Consult Status: Complete    [name] 02/07/2022, 9:35 PM

## 2022-02-07 NOTE — Transfer of Care (Signed)
Immediate Anesthesia Transfer of Care Note  Patient: Dominique Barnes  Procedure(s) Performed: TWIN VAGINAL DELIVERY  Patient Location: PACU and Mother/Baby  Anesthesia Type:Epidural  Level of Consciousness: awake, alert , oriented, and patient cooperative  Airway & Oxygen Therapy: Patient Spontanous Breathing  Post-op Assessment: Post -op Vital signs reviewed and stable  Post vital signs: Reviewed and stable  Last Vitals:  Vitals Value Taken Time  BP    Temp    Pulse    Resp    SpO2      Last Pain:  Vitals:   02/07/22 1900  TempSrc:   PainSc: 0-No pain         Complications: No notable events documented.

## 2022-02-07 NOTE — Progress Notes (Signed)
OB Progress Note  S: comfortable with epidural   O: Today's Vitals   02/07/22 1315 02/07/22 1320 02/07/22 1325 02/07/22 1330  BP: 126/74 126/78 116/77 123/79  Pulse: 89 89 90 79  Resp: 16 16 16 16   Temp:      TempSrc:      SpO2: 98% 99% 100% 99%  Weight:      Height:      PainSc:       Body mass index is 27.7 kg/m.  SVE 3/70/-1 AROM clear fluid  Fetal testing: baby A FHR 135, mod variability, + accels, no decels Baby B 130, mod variability, + accels, no decels Toco: q 3-5 mins   A/P: 31D G3P2003 @ [redacted]w[redacted]d, IOL chronic hypertension, vtx/breech DCDA twins Fetal wellbeing; cat I tracing x 2 IOL: s/p AROM, continue pitocin H/o CHTN: normotensive, no meds required, continue to monitor H/o PPH: at risk for PPH given twins, plan txa prophylactically at delivery and uterotonics as needed Vtx/breech: plan delivery in OR. Patient counseled on breech extraction and risks in office. Agrees to proceed   Rowland Lathe 02/07/2022,  1:40 PM

## 2022-02-07 NOTE — Lactation Note (Signed)
This note was copied from a baby's chart. Lactation Consultation Note  Patient Name: Dominique Barnes [id number] Date: 02/07/2022   Age:31 hours Mom's choice of feeding is formula.  Maternal Data    Feeding    LATCH Score                    Lactation Tools Discussed/Used    Interventions    Discharge    Consult Status Consult Status: Complete    Aeon Koors G 02/07/2022, 9:35 PM

## 2022-02-07 NOTE — H&P (Signed)
Dominique Barnes is a 31 y.o. female G73P2003 [redacted]w[redacted]d presenting for scheduled induction of labor for chronic hypertension, DCDA twins (vertex/breech). She reports pelvic pressure but no LOF, VB, Contractions. Normal FM.   Pregnancy c/b: DCDA twin pregnancy: last scan 10/5 baby A (maternal right, vertex) 6#7, baby B (maternal left, complete breech) 6#4. Normal fluid. BPP 8/8 x 2. No discordance. Of note, this is patient's second twin pregnancy, for which she had a successful vertex/breech delivery in 2021. History of chronic hypertension, not on medications.  History of PPH on postpartum unit 3-4 hours after last twin delivery - responded to uterotonics and bimanual exam. Did not require blood transfusion.   OB History     Gravida  3   Para  2   Term  2   Preterm      AB      Living  3      SAB      IAB      Ectopic      Multiple  1   Live Births  3          Past Medical History:  Diagnosis Date   Hypertension    Past Surgical History:  Procedure Laterality Date   ANKLE SURGERY     CESAREAN SECTION N/A 07/09/2019   Procedure: VAGINAL DELIVERY IN OR;  Surgeon: Bobbye Charleston, MD;  Location: [facility];  Service: Obstetrics;  Laterality: N/A;   NOSE SURGERY     Family History: family history includes Cancer in her maternal great-grandfather and another family member; Diabetes in her maternal great-grandfather; Heart attack in her mother; Heart disease in her mother; Hypertension in her paternal aunt. Social History:  reports that she has never smoked. She has never used smokeless tobacco. She reports that she does not currently use alcohol. She reports that she does not use drugs.     Maternal Diabetes: No Genetic Screening: Declined Maternal Ultrasounds/Referrals: Normal Fetal Ultrasounds or other Referrals:  Referred to Materal Fetal Medicine  twins Maternal Substance Abuse:  No Significant Maternal Medications:  None Significant Maternal Lab Results:   Group B Strep negative Other Comments:  None    Review of Systems Per HP  Exam Physical Exam  Dilation: 2 Effacement (%): 50 Station: -2 Exam by:: Irene Pap Last menstrual period 05/15/2021, unknown if currently breastfeeding. Gen: NAD, resting comfortably CVS: normal pulses  Lungs: nonlabored respirations Abd: Gravid abdomen Ext; no calf edema or tenderness  Bedside TAUS by me: baby A vertex maternal right, baby B complete breech maternal left  Fetal testing: baby A FHR 140, mod variability, + accels, no decels Baby B 145bpm, mod variability, + accels, no decels Toco: acontractile Prenatal labs: ABO, Rh: O pos   Antibody: Negative (04/10 0000) Rubella: Immune (04/10 0000) RPR: Nonreactive (04/10 0000)  HBsAg: Negative (04/10 0000)  HIV: Non-reactive (04/10 0000)  GBS: Negative/-- (10/05 0000)   Assessment/Plan: 31Y G3P2003 @ [redacted]w[redacted]d, IOL chronic hypertension, vtx/breech DCDA twins Fetal wellbeing; cat I tracing x 2 IOL: start pitocin, plan AROM when fetal station lower H/o CHTN: normotensive, no meds required, continue to monitor H/o PPH: at risk for PPH given twins, plan txa prophylactically at delivery and uterotonics as needed Vtx/breech: plan delivery in OR. Patient counseled on breech extraction and risks in office. Agrees to proceed  Rowland Lathe 02/07/2022, 10:14 AM

## 2022-02-07 NOTE — Anesthesia Procedure Notes (Signed)
Epidural Patient location during procedure: OB Start time: 02/07/2022 12:51 PM End time: 02/07/2022 12:58 PM  Staffing Anesthesiologist: Pervis Hocking, DO Performed: anesthesiologist   Preanesthetic Checklist Completed: patient identified, IV checked, risks and benefits discussed, monitors and equipment checked, pre-op evaluation and timeout performed  Epidural Patient position: sitting Prep: DuraPrep and site prepped and draped Patient monitoring: continuous pulse ox, blood pressure, heart rate and cardiac monitor Approach: midline Location: L3-L4 Injection technique: LOR air  Needle:  Needle type: Tuohy  Needle gauge: 17 G Needle length: 9 cm Needle insertion depth: 4 cm Catheter type: closed end flexible Catheter size: 19 Gauge Catheter at skin depth: 9 cm Test dose: negative  Assessment Sensory level: T8 Events: blood not aspirated, injection not painful, no injection resistance, no paresthesia and negative IV test  Additional Notes Patient identified. Risks/Benefits/Options discussed with patient including but not limited to bleeding, infection, nerve damage, paralysis, failed block, incomplete pain control, headache, blood pressure changes, nausea, vomiting, reactions to medication both or allergic, itching and postpartum back pain. Confirmed with bedside nurse the patient's most recent platelet count. Confirmed with patient that they are not currently taking any anticoagulation, have any bleeding history or any family history of bleeding disorders. Patient expressed understanding and wished to proceed. All questions were answered. Sterile technique was used throughout the entire procedure. Please see nursing notes for vital signs. Test dose was given through epidural catheter and negative prior to continuing to dose epidural or start infusion. Warning signs of high block given to the patient including shortness of breath, tingling/numbness in hands, complete motor  block, or any concerning symptoms with instructions to call for help. Patient was given instructions on fall risk and not to get out of bed. All questions and concerns addressed with instructions to call with any issues or inadequate analgesia.  Reason for block:procedure for pain

## 2022-02-08 ENCOUNTER — Encounter (HOSPITAL_COMMUNITY): Payer: Self-pay | Admitting: Obstetrics and Gynecology

## 2022-02-08 LAB — CBC
HCT: 34.7 % — ABNORMAL LOW (ref 36.0–46.0)
Hemoglobin: 11.3 g/dL — ABNORMAL LOW (ref 12.0–15.0)
MCH: 28.6 pg (ref 26.0–34.0)
MCHC: 32.6 g/dL (ref 30.0–36.0)
MCV: 87.8 fL (ref 80.0–100.0)
Platelets: 219 10*3/uL (ref 150–400)
RBC: 3.95 MIL/uL (ref 3.87–5.11)
RDW: 21.3 % — ABNORMAL HIGH (ref 11.5–15.5)
WBC: 14.9 10*3/uL — ABNORMAL HIGH (ref 4.0–10.5)
nRBC: 0 % (ref 0.0–0.2)

## 2022-02-08 NOTE — Progress Notes (Signed)
Post Partum Day 1 Subjective: up ad lib, voiding, and tolerating PO  Uterine cramping noted but handling with po meds.  Normal bleeding.  Babies bottlefeeding well.   Objective: Blood pressure 123/72, pulse 64, temperature 98.2 F (36.8 C), temperature source Oral, resp. rate 17, height 5\' 8"  (1.727 m), weight 82.6 kg, last menstrual period 05/15/2021, SpO2 98 %, unknown if currently breastfeeding.  Physical Exam:  General: alert and cooperative Lochia: appropriate Uterine Fundus: firm   Recent Labs    02/07/22 0945 02/08/22 0507  HGB 12.7 11.3*  HCT 39.5 34.7*    Assessment/Plan: Plan for discharge tomorrow  d/w parents circumcision procedure in detail and they desire to proceed.  Babies are feeding pretty well.  Twin B with humerus fracture doing fine.  Since delivered at 8pm will check later today to see if candidates for circumcision   LOS: 1 day   Logan Bores, MD 02/08/2022, 8:26 AM

## 2022-02-08 NOTE — Anesthesia Postprocedure Evaluation (Signed)
Anesthesia Post Note  Patient: Dominique Barnes  Procedure(s) Performed: TWIN VAGINAL DELIVERY     Patient location during evaluation: Mother Baby Anesthesia Type: Epidural Level of consciousness: awake and alert Pain management: pain level controlled Vital Signs Assessment: post-procedure vital signs reviewed and stable Respiratory status: spontaneous breathing, nonlabored ventilation and respiratory function stable Cardiovascular status: stable Postop Assessment: no headache, no backache, epidural receding, no apparent nausea or vomiting, patient able to bend at knees, adequate PO intake and able to ambulate Anesthetic complications: no   No notable events documented.  Last Vitals:  Vitals:   02/08/22 0020 02/08/22 0527  BP: 90/72 123/72  Pulse: 64 64  Resp: 17 17  Temp: 36.8 C   SpO2:      Last Pain:  Vitals:   02/08/22 0735  TempSrc:   PainSc: 0-No pain   Pain Goal:                   Jabier Mutton

## 2022-02-09 MED ORDER — IBUPROFEN 600 MG PO TABS: 600.0000 mg | ORAL_TABLET | Freq: Four times a day (QID) | ORAL | 0 refills | Status: AC | PRN

## 2022-02-09 NOTE — Discharge Summary (Signed)
**Dominique Barnes De-Identified via Obfuscation** Postpartum Discharge Summary       Patient Name: Dominique Barnes DOB: 17-Mar-1991 MRN: [id number]  Date of admission: 02/07/2022 Dominique Barnes date:   Dominique Barnes, Dominique Barnes [[id number]]  02/07/2022    Dominique Barnes, Dominique Barnes [[id number]]  02/07/2022  Delivering provider:    Siara, Dominique Barnes [[id number]]  Dominique Barnes, Dominique Barnes, Dominique Barnes Dominique Barnes [[id number]]  Dominique Barnes Dominique Barnes Dominique Barnes  Date of discharge: 02/09/2022  Admitting diagnosis: Twin pregnancy [O30.009] Intrauterine pregnancy: [redacted]w[redacted]d     Secondary diagnosis:  Principal Problem:   Twin pregnancy      Discharge diagnosis: Term Pregnancy Delivered                                              Post partum procedures: NA Augmentation: AROM and Pitocin Complications: None  Hospital course: Induction of Labor With Dominique Barnes Dominique Barnes   31 y.o. yo G3P3005 at [redacted]w[redacted]d was admitted to the hospital 02/07/2022 for induction of labor.  Indication for induction:  Di/Di twins .  Patient had an labor course complicated by nothing Membrane Rupture Time/Date:    Dominique Barnes, Dominique Barnes [[id number]]  1:30 PM    Dominique Barnes Dominique Barnes Dominique Barnes Dominique Barnes [[id number]]  1:30 PM ,   Dominique Barnes, Dominique Barnes [[id number]]  02/07/2022    Dominique Barnes, Dominique Barnes [[id number]]  02/07/2022   Dominique Barnes Method:   Dominique Barnes Dominique Barnes [[id number]]  Dominique Barnes, Dominique Barnes    Dominique Barnes, Dominique Barnes Dominique Barnes Dominique Barnes [[id number]]  Dominique Barnes, Dominique Barnes  Dominique Barnes:    Dominique Barnes, Dominique Barnes [[id number]]  None    Dominique Barnes, Dominique Barnes Dominique Barnes Dominique Barnes [[id number]]  None  Lacerations:     Dominique Barnes, Dominique Barnes [[id number]]  1st degree    Dominique Barnes, Dominique Barnes [[id number]]  1st degree  Dominique Barnes of Dominique Barnes Dominique Barnes Dominique Barnes Dominique Barnes Dominique Barnes Dominique Barnes Dominique Barnes Dominique Barnes.  Patient had a postpartum course complicated byNOthing. Patient is discharged home 02/09/22.  Newborn Data: Birth date:   Dominique Barnes, Dominique Barnes [[id number]]  02/07/2022    Dominique Barnes, Dominique Barnes  [[id number]]  02/07/2022  Birth time:   Dominique Barnes, Dominique Barnes [[id number]]  8:20 PM    Dominique Barnes, Dominique Barnes [[id number]]  8:27 PM  Gender:   Dominique Barnes, Dominique Barnes [[id number]]  Dominique Barnes    Dominique Barnes, Dominique Barnes [[id number]]  Dominique Barnes  Dominique Barnes status:   Dominique Barnes, Dominique Barnes [[id number]]  Dominique Barnes    Dominique Barnes, Dominique Barnes [[id number]]  Dominique Barnes  Apgars:   Dominique Barnes, Dominique Barnes [[id number]]  [address] Dominique Barnes [[id number]]  Dominique Barnes, Dominique Barnes [[id number]]  [address] Dominique Barnes [[id number]]  9  Dominique Barnes:   Dominique Barnes, Dominique Barnes [[id number]]  3210 g    Dominique Barnes, Dominique Barnes [[id number]]  2980 g   Dominique Barnes Dominique Barnes Dominique Barnes: Dominique Barnes Dominique Barnes Dominique Barnes: Dominique Barnes Dominique Barnes:Dominique Barnes  Physical exam  Vitals:   02/08/22 0527 02/08/22 1509 02/08/22 2100 02/09/22 0613  BP: 123/72 116/62 120/68 (!) 117/58  Pulse: 64 68 71 (!) 56  Resp: 17 18 18 18   Temp:  98.3 F (36.8 C) 98.1 F (36.7 C) 98 F (36.7 C)  TempSrc:  Oral Oral   SpO2:  97% 100% 100%  Dominique Barnes:      Height:       General: alert, cooperative, and Dominique Barnes distress Lochia: appropriate Uterine Fundus: firm DVT Evaluation: Dominique Barnes evidence of DVT seen on physical exam. Labs: Lab Results  Component Value Date   WBC 14.9 (H) 02/08/2022  HGB 11.3 (L) 02/08/2022   HCT 34.7 (L) 02/08/2022   MCV 87.8 02/08/2022   PLT 219 02/08/2022      Latest Ref Rng & Units 07/09/2019    9:31 AM  CMP  Glucose 70 - 99 mg/dL 85   BUN 6 - 20 mg/dL 7   Creatinine 0.44 - 1.00 mg/dL 0.68   Sodium 135 - 145 mmol/L 139   Potassium 3.5 - 5.1 mmol/L 4.2   Chloride 98 - 111 mmol/L 106   CO2 22 - 32 mmol/L 22   Calcium 8.9 - 10.3 mg/dL 8.8   Total Protein 6.5 - 8.1 g/dL 6.2   Total Bilirubin 0.3 - 1.2 mg/dL 0.5   Alkaline Phos 38 - 126 U/L 169   AST 15 - 41 U/L 22   ALT 0 - 44 U/L 19    Edinburgh Score:    02/08/2022    9:22 AM  Edinburgh Postnatal Depression Scale Screening Tool  I have been able  to laugh and see the funny side of things. 0  I have looked forward with enjoyment to things. 0  I have blamed myself unnecessarily when things went wrong. 0  I have been anxious or worried for Dominique Barnes good reason. 0  I have felt scared or panicky for Dominique Barnes good reason. 0  Things have been getting on top of me. 0  I have been so unhappy that I have had difficulty sleeping. 0  I have felt sad or miserable. 0  I have been so unhappy that I have been crying. 0  The thought of harming myself has occurred to me. 0  Edinburgh Postnatal Depression Scale Total 0      After visit meds:     Discharge home Dominique Barnes stable condition Infant Feeding: Bottle Infant Disposition:home with mother Discharge instruction: per After Visit Summary and Postpartum booklet. Activity: Advance as tolerated. Pelvic rest for 6 weeks.  Diet: routine diet Anticipated Birth Control: Unsure Postpartum Appointment:4 weeks Future Appointments:Dominique Barnes future appointments. Follow up Visit:      02/09/2022 Vanessa Kick, MD

## 2022-02-09 NOTE — Progress Notes (Signed)
Post Partum Day 1 Subjective: no complaints, up ad lib, voiding, and tolerating PO  Objective: Blood pressure (!) 117/58, pulse (!) 56, temperature 98 F (36.7 C), resp. rate 18, height 5\' 8"  (1.727 m), weight 82.6 kg, last menstrual period 05/15/2021, SpO2 100 %, unknown if currently breastfeeding.  Physical Exam:  General: alert, cooperative, and appears stated age Lochia: appropriate Uterine Fundus: firm DVT Evaluation: No evidence of DVT seen on physical exam.  Recent Labs    02/07/22 0945 02/08/22 0507  HGB 12.7 11.3*  HCT 39.5 34.7*    Assessment/Plan: Discharge home Desires neonatal circumcision, R/B/A of procedure discussed at length. Pt understands that neonatal circumcision is not considered medically necessary and is elective. The risks include, but are not limited to bleeding, infection, damage to the penis, development of scar tissue, and having to have it redone at a later date. Pt understands theses risks and wishes to proceed    LOS: 2 days   Vanessa Kick, MD 02/09/2022, 12:06 PM

## 2022-02-20 ENCOUNTER — Telehealth (HOSPITAL_COMMUNITY): Payer: Self-pay | Admitting: *Deleted

## 2022-02-20 NOTE — Telephone Encounter (Signed)
Mom reports feeling good. No concerns about herself at this time. EPDS not completed, but mom reports feeling well emotionally. [facility] score=0) Mom reports babies are doing well. Feeding, peeing, and pooping without difficulty. Safe sleep reviewed. Mom reports no concerns about babies at present.  Odis Hollingshead, RN 02-20-2022 at 1:31pm

## 2024-02-08 IMAGING — US US MFM OB DETAIL+14 WK
1 series · 13 of 28 positions shown · non-contrast
Comparison: none

[Series 1: us mfm ob detail+14 wk · 196 acquisitions, 13 frames shown]
[im 8/196]
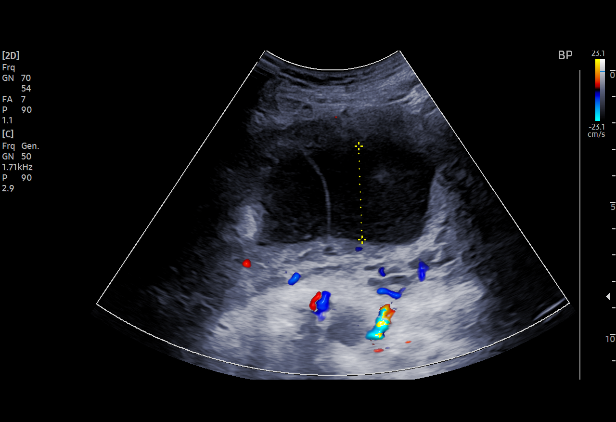
[im 22/196]
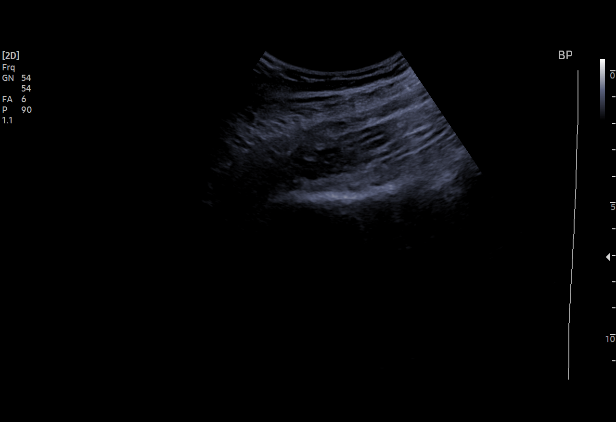
[im 37/196]
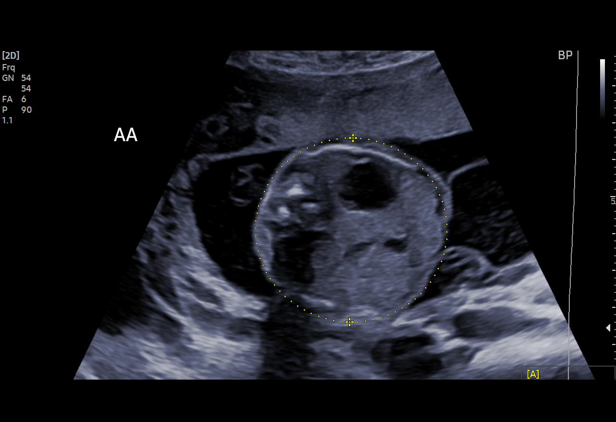
[im 51/196]
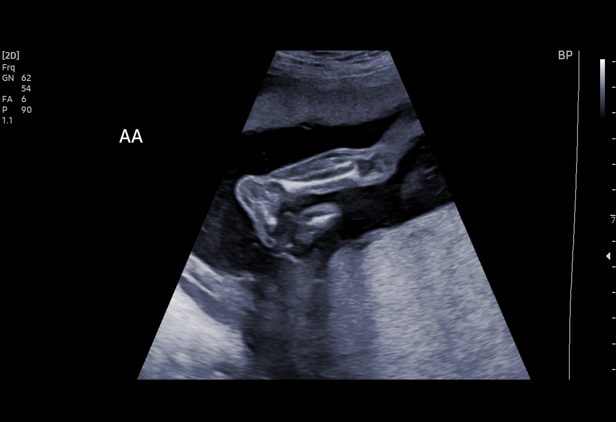
[im 66/196]
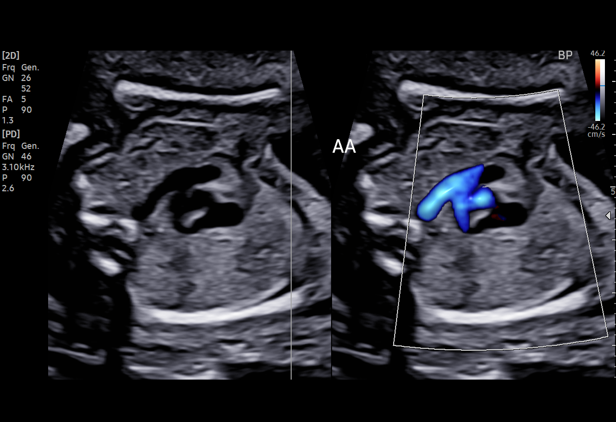
[im 80/196]
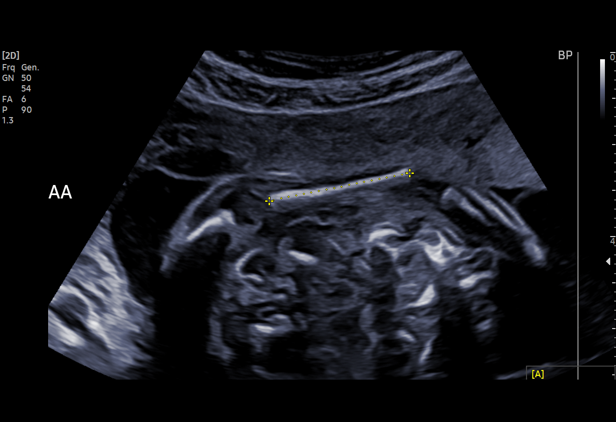
[im 102/196]
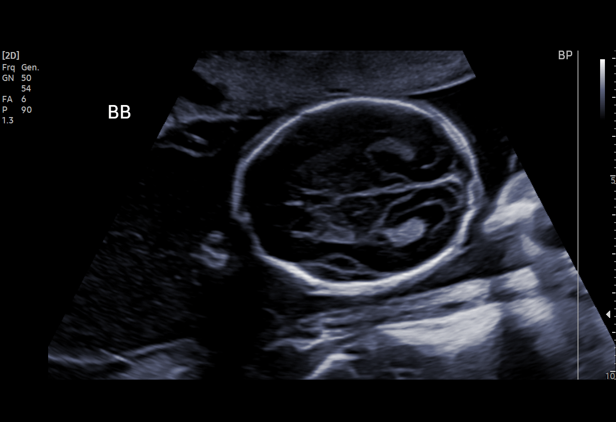
[im 116/196]
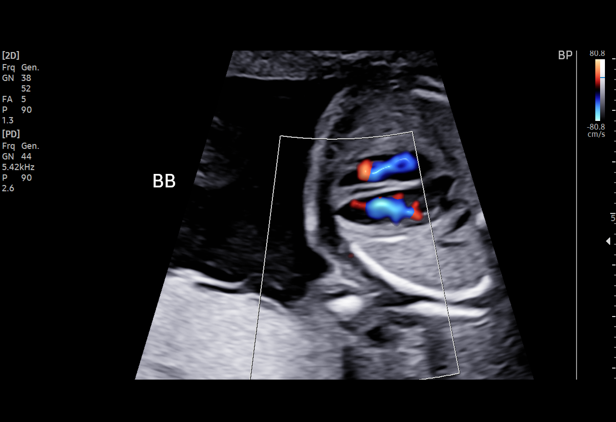
[im 131/196]
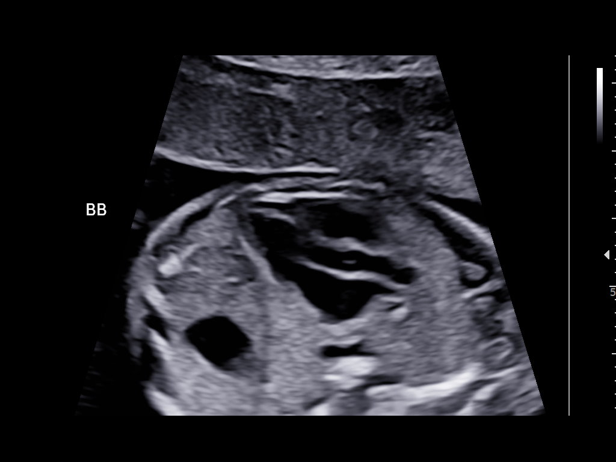
[im 145/196]
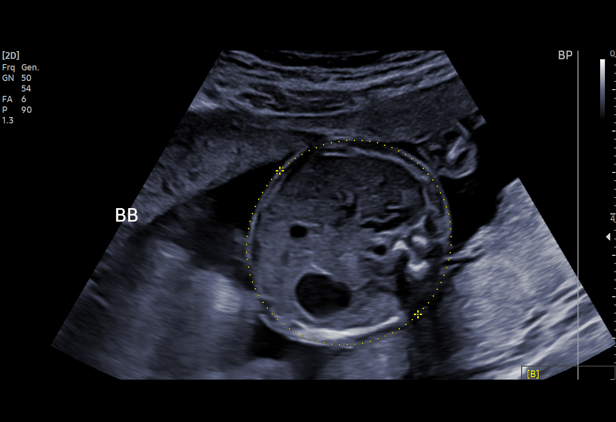
[im 159/196]
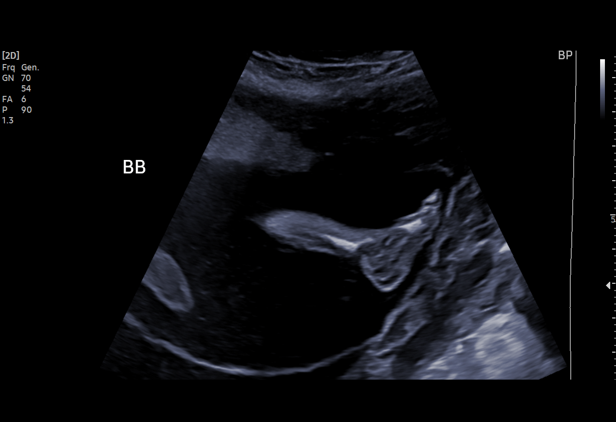
[im 174/196]
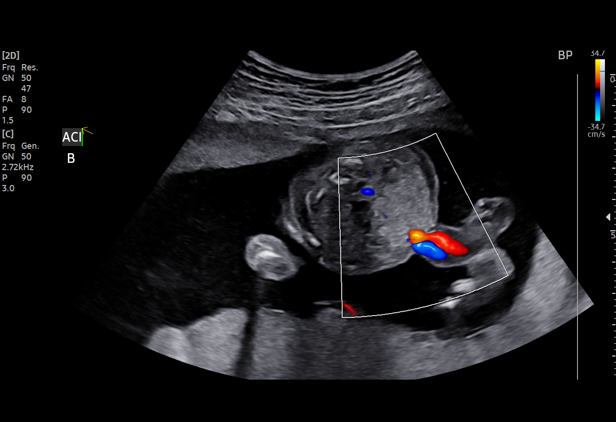
[im 188/196]
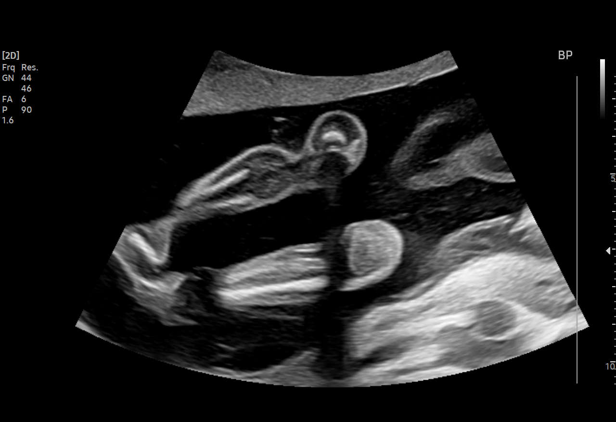

[13 of 28 positions shown; findings below may reference images not displayed]

[facility]

                                                            OB/Gyn and
                                                            Infertility
                   OB/Gyn

 2  US MFM OB DETAIL ADDL GEST            76811.02    MAYRITA CORTEZ
    +14 WK

Indications

 Twin pregnancy, di/di, second trimester
 Hypertension - Chronic/Pre-existing (no
 meds)
 Encounter for antenatal screening for
 malformations
 20 weeks gestation of pregnancy
 Short interval between pregancies, 2nd
 trimester
 Declined genetic testing
Vital Signs

 BP:          119/65
Fetal Evaluation (Fetus A)

 Num Of Fetuses:         2
 Fetal Heart Rate(bpm):  141
 Cardiac Activity:       Observed
 Fetal Lie:              Maternal right side
 Presentation:           Cephalic
 Placenta:               Anterior
 P. Cord Insertion:      Visualized, central
 Membrane Desc:      Dividing Membrane seen - Dichorionic.

 Amniotic Fluid
 AFI FV:      Within normal limits

                             Largest Pocket(cm)

Biometry (Fetus A)

 BPD:      48.7  mm     G. Age:  20w 5d         79  %    CI:        73.89   %    70 - 86
                                                         FL/HC:      18.6   %    16.8 -
 HC:    179.93   mm     G. Age:  20w 3d         62  %    HC/AC:      1.15        1.09 -
 AC:    156.04   mm     G. Age:  20w 5d         70  %    FL/BPD:     68.6   %
 FL:      33.43  mm     G. Age:  20w 3d         60  %    FL/AC:      21.4   %    20 - 24
 HUM:      31.1  mm     G. Age:  20w 2d         62  %
 CER:      22.3  mm     G. Age:  20w 6d         91  %
 NFT:       5.7  mm
 LV:        6.4  mm
 CM:        5.6  mm

 Est. FW:     366  gm    0 lb 13 oz      80  %     FW Discordancy      0 \ 8 %
OB History

 Blood Type:   O+
 Gravidity:    3         Term:   2
 Living:       3
Gestational Age (Fetus A)

 LMP:           20w 0d        Date:  05/15/21                 EDD:   02/19/22
 U/S Today:     20w 4d                                        EDD:   02/15/22
 Best:          20w 0d     Det. By:  LMP  (05/15/21)          EDD:   02/19/22
Anatomy (Fetus A)

 Cranium:               Appears normal         LVOT:                   Appears normal
 Cavum:                 Appears normal         Aortic Arch:            Not well visualized
 Ventricles:            Appears normal         Ductal Arch:            Not well visualized
 Choroid Plexus:        Appears normal         Diaphragm:              Appears normal
 Cerebellum:            Appears normal         Stomach:                Appears normal, left
                                                                       sided
 Posterior Fossa:       Appears normal         Abdomen:                Appears normal
 Nuchal Fold:           Appears normal         Abdominal Wall:         Appears nml (cord
                                                                       insert, abd wall)
 Face:                  Appears normal         Cord Vessels:           Appears normal (3
                        (orbits and profile)                           vessel cord)
 Lips:                  Appears normal         Kidneys:                Appear normal
 Palate:                Appears normal         Bladder:                Appears normal
 Thoracic:              Appears normal         Spine:                  Not well visualized
 Heart:                 Appears normal         Upper Extremities:      Appears normal
                        (4CH, axis, and
                        situs)
 RVOT:                  Appears normal         Lower Extremities:      Appears normal
 Other:  Fetus appears to be a male. VC, 3VV and 3VTV visualized.
         Heels/feet and open left  hand/5th digit visualized. Nasal bone,
         maxilla, mandible and falx visualized

Fetal Evaluation (Fetus B)

 Num Of Fetuses:         2
 Fetal Heart Rate(bpm):  149
 Cardiac Activity:       Observed
 Fetal Lie:              Maternal left side
 Presentation:           Cephalic
 Placenta:               Posterior
 P. Cord Insertion:      Visualized, central
 Membrane Desc:      Dividing Membrane seen - Dichorionic.

 Amniotic Fluid
 AFI FV:      Within normal limits

                             Largest Pocket(cm)

Biometry (Fetus B)

 BPD:     47.33  mm     G. Age:  20w 2d         63  %    CI:        72.56   %    70 - 86
                                                         FL/HC:      17.5   %    16.8 -
 HC:    176.72   mm     G. Age:  20w 1d         50  %    HC/AC:      1.14        1.09 -
 AC:    154.52   mm     G. Age:  20w 4d         66  %    FL/BPD:     65.2   %
 FL:      30.88  mm     G. Age:  19w 4d         27  %    FL/AC:      20.0   %    20 - 24
 HUM:      31.5  mm     G. Age:  20w 4d         67  %
 CER:      20.3  mm     G. Age:  19w 4d         57  %
 NFT:       5.2  mm
 LV:        5.7  mm
 CM:        3.7  mm

 Est. FW:     335  gm    0 lb 12 oz      54  %     FW Discordancy         8  %
Gestational Age (Fetus B)

 LMP:           20w 0d        Date:  05/15/21                 EDD:   02/19/22
 U/S Today:     20w 1d                                        EDD:   02/18/22
 Best:          20w 0d     Det. By:  LMP  (05/15/21)          EDD:   02/19/22
Anatomy (Fetus B)

 Cranium:               Appears normal         LVOT:                   Appears normal
 Cavum:                 Appears normal         Aortic Arch:            Appears normal
 Ventricles:            Appears normal         Ductal Arch:            Appears normal
 Choroid Plexus:        Right choroid          Diaphragm:              Appears normal
                        plexus cyst
 Cerebellum:            Appears normal         Stomach:                Appears normal, left
                                                                       sided
 Posterior Fossa:       Appears normal         Abdomen:                Appears normal
 Nuchal Fold:           Appears normal         Abdominal Wall:         Appears nml (cord
                                                                       insert, abd wall)
 Face:                  Appears normal         Cord Vessels:           Appears normal (3
                        (orbits and profile)                           vessel cord)
 Lips:                  Appears normal         Kidneys:                Appear normal
 Palate:                Not well visualized    Bladder:                Appears normal
 Thoracic:              Appears normal         Spine:                  Not well visualized
 Heart:                 Appears normal         Upper Extremities:      Appears normal
                        (4CH, axis, and
                        situs)
 RVOT:                  Appears normal         Lower Extremities:      Appears normal

 Other:  Fetus appears to be a male. VC, 3VV and 3VTV visualized. Nasal
         bone, lenses, maxilla, mandible and falx visualized. Heels/feet and
         open left hand/5th digit visualized.
Cervix Uterus Adnexa

 Cervix
 Length:            3.1  cm.
 Normal appearance by transabdominal scan.

 Uterus
 Normal shape and size.

 Right Ovary
 Not visualized.

 Left Ovary
 Not visualized.

 Cul De Sac
 No free fluid seen.

 Adnexa
 No adnexal mass visualized.
Comments

 Derwin Belmares was seen due to a spontaneously
 conceived twin pregnancy.  She has a history of chronic
 hypertension that is not treated with any medications.  She
 has a history of a prior dichorionic, diamniotic twin pregnancy.
 She has declined all screening tests for fetal aneuploidy in
 her pregnancy.
 A thick dividing membrane was noted separating the two
 fetuses, indicating that these are dichorionic, diamniotic twins.
 The fetal growth and amniotic fluid level appeared
 appropriate for both twin A and twin B.  The fetal biometry
 measurements confirm an EDC February 19, 2022.
 There were no obvious anomalies noted in either twin A or
 twin B today.  However the views of the fetal anatomy were
 limited today due to the fetal positions.
 The limitations of ultrasound in the detection of all anomalies
 was discussed today.
 The management of dichorionic twins was discussed.  She
 was advised that management of twin pregnancies will
 involve frequent ultrasound exams to assess the fetal growth
 and amniotic fluid level. We will continue to follow her with
 serial growth ultrasounds.
 Weekly fetal testing for dichorionic twins should start at
 around 34 weeks.
 Delivery for uncomplicated dichorionic twins should occur at
 around 38 weeks.
 The increased risk of preeclampsia, gestational diabetes, and
 preterm birth/labor associated with twin pregnancies was
 discussed.
 As pregnancies with multiple gestations are at increased risk
 for developing preeclampsia, she was advised to take a daily
 baby aspirin (81 mg per day) to decrease her risk of
 developing preeclampsia.
 She was offered and declined a cell free DNA test today to
 screen for fetal aneuploidy and to determine the zygosity of
 the fetuses.
 A follow-up exam was scheduled in 4 weeks to assess the
 fetal growth and to complete the views of the fetal anatomy.

 The patient stated that all of her questions were answered
 today.

 A total of 30 minutes was spent counseling and coordinating
 the care for this patient.  Greater than 50% of the time was
 spent in direct face-to-face contact.
